# Patient Record
Sex: Female | Born: 1985 | Hispanic: Yes | Marital: Married | Smoking: Never smoker
Health system: Southern US, Community
[De-identification: ages and names within clinical notes are randomized; demographics above are authoritative.]

---
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
## 2005-05-05 ENCOUNTER — Emergency Department: Payer: Self-pay | Admitting: Emergency Medicine

## 2005-07-16 ENCOUNTER — Ambulatory Visit: Payer: Self-pay | Admitting: Family Medicine

## 2005-10-28 ENCOUNTER — Inpatient Hospital Stay: Payer: Self-pay | Admitting: Obstetrics and Gynecology

## 2007-06-21 IMAGING — US US OB US >=[ID] SNGL FETUS
1 series · 14 of 28 positions shown · non-contrast
Comparison: none

REASON FOR EXAM: Dates and anatomy
COMMENTS:

[Series 1: us ob us >=(id) sngl fetus · 0.39mm/px · 14 of 95 slices shown]
[im 4/95]
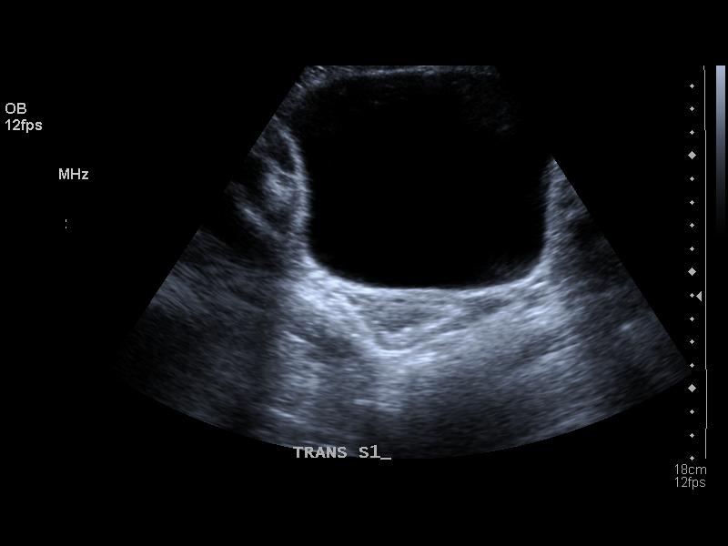
[im 11/95]
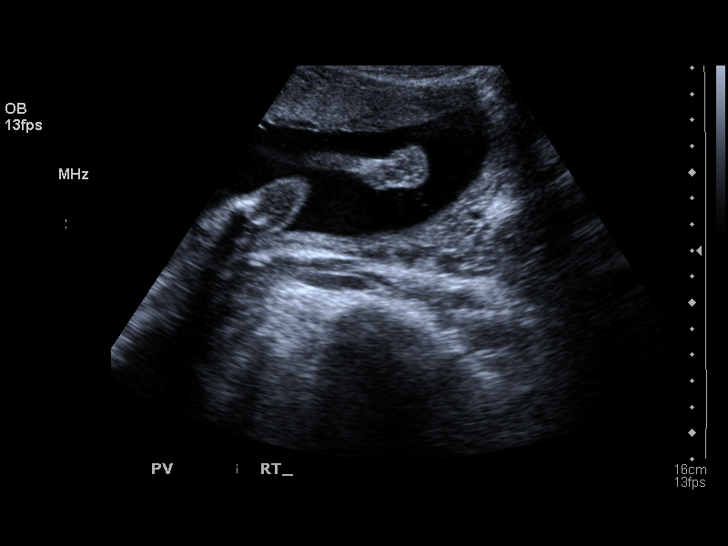
[im 18/95]
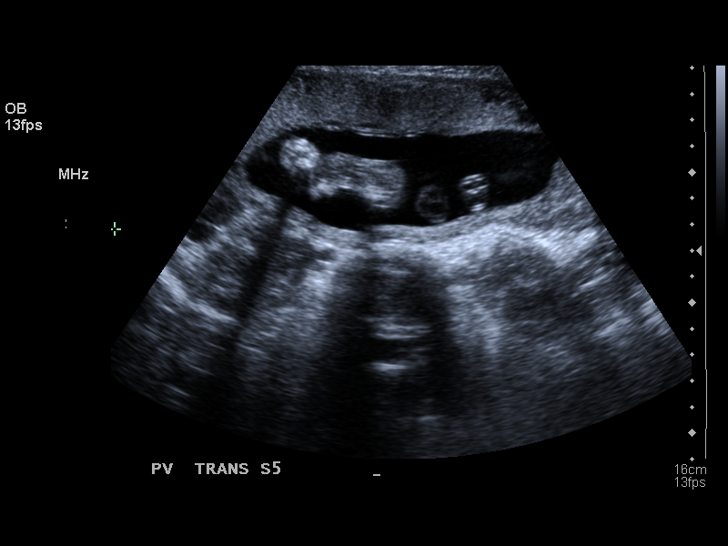
[im 25/95]
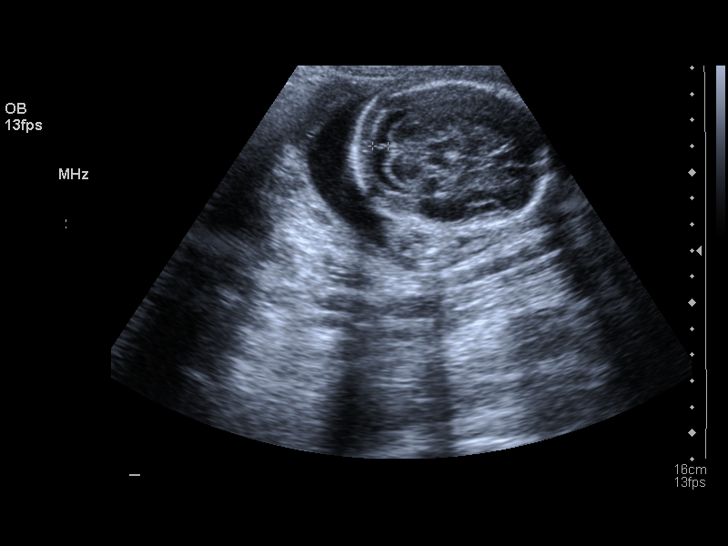
[im 32/95]
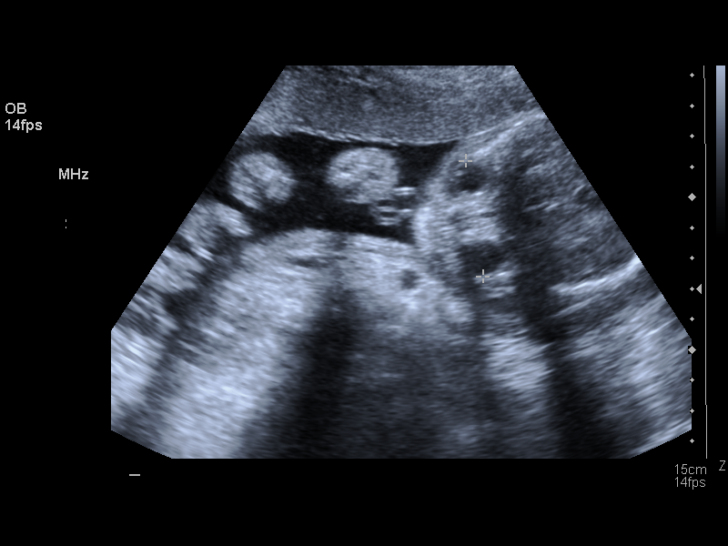
[im 39/95]
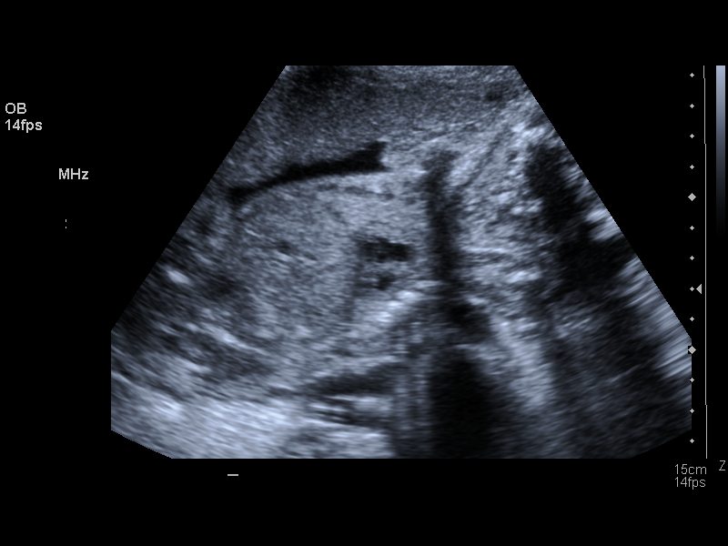
[im 46/95]
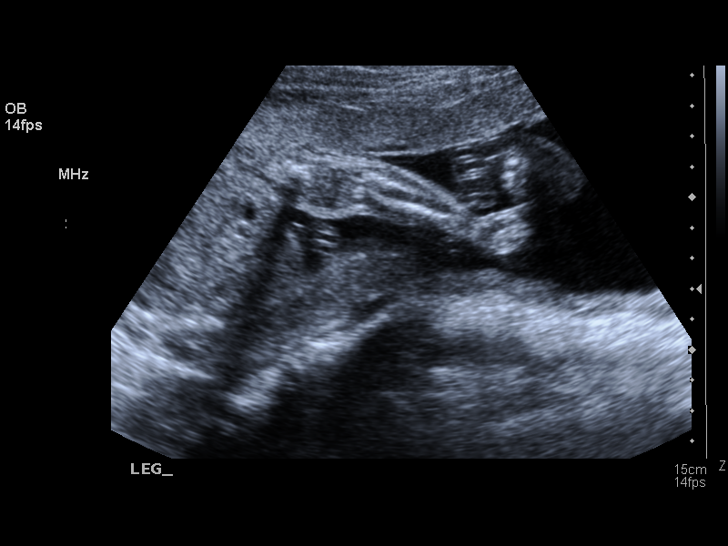
[im 53/95]
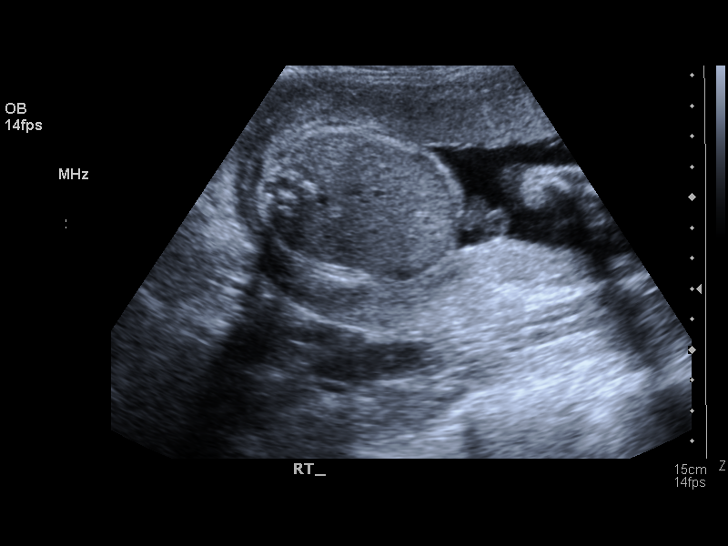
[im 60/95]
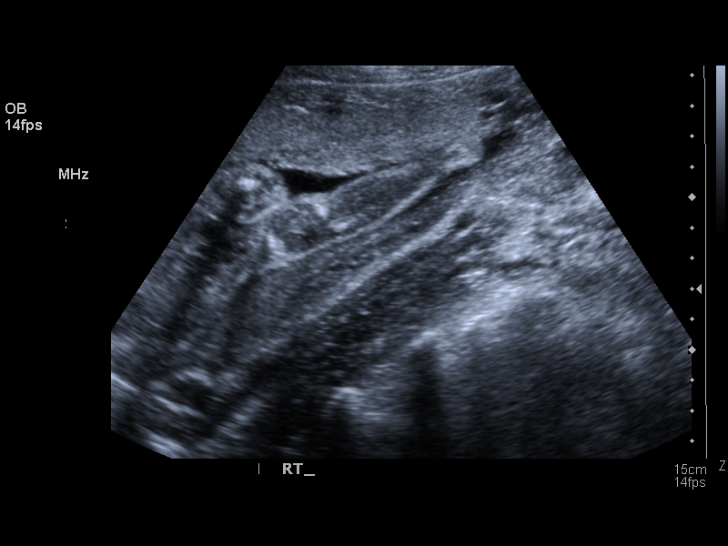
[im 67/95]
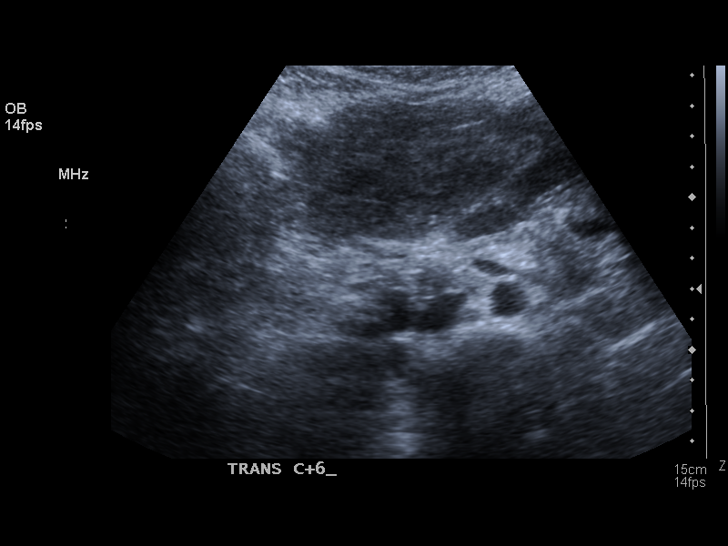
[im 74/95]
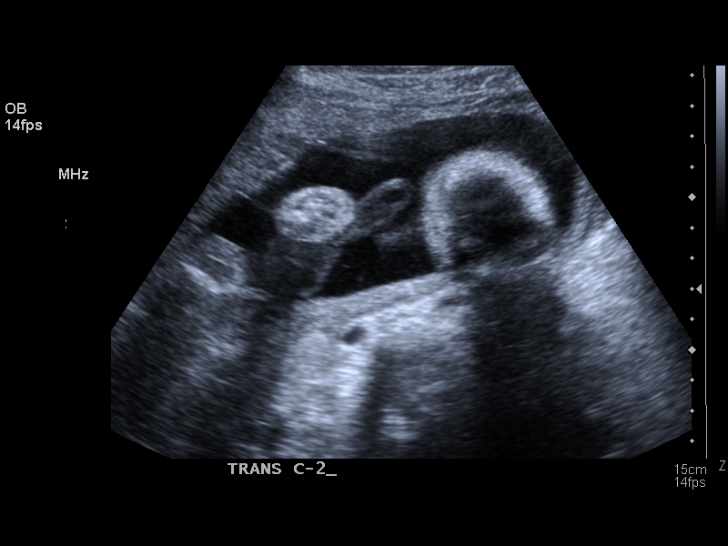
[im 81/95]
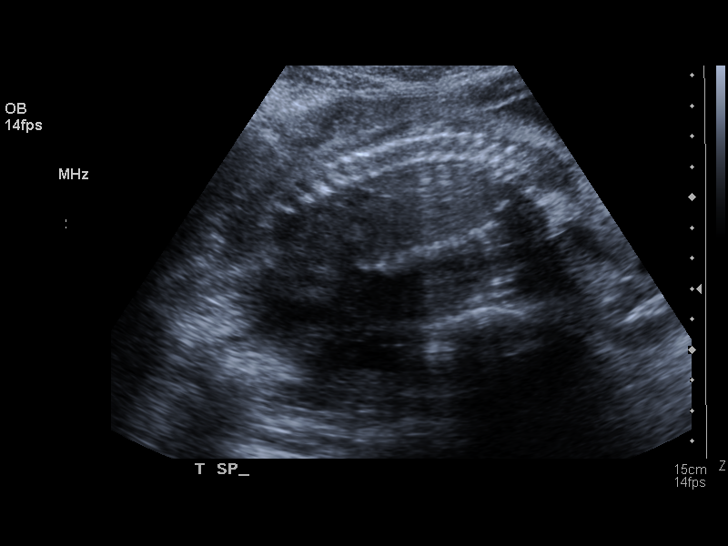
[im 88/95]
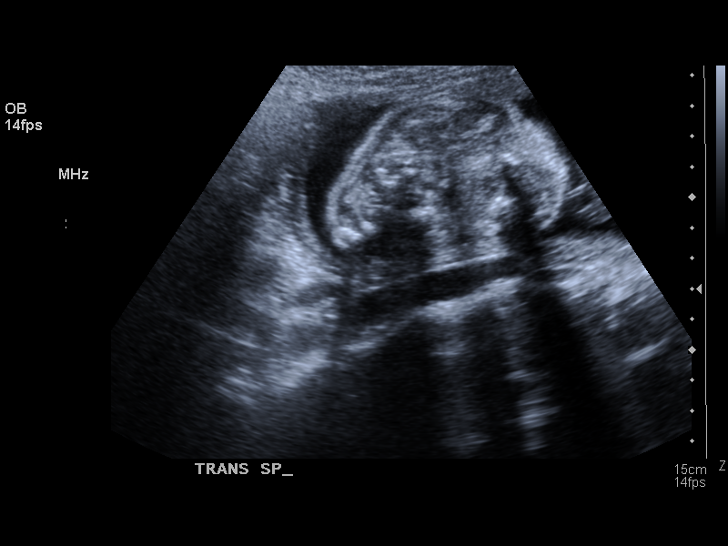
[im 95/95]
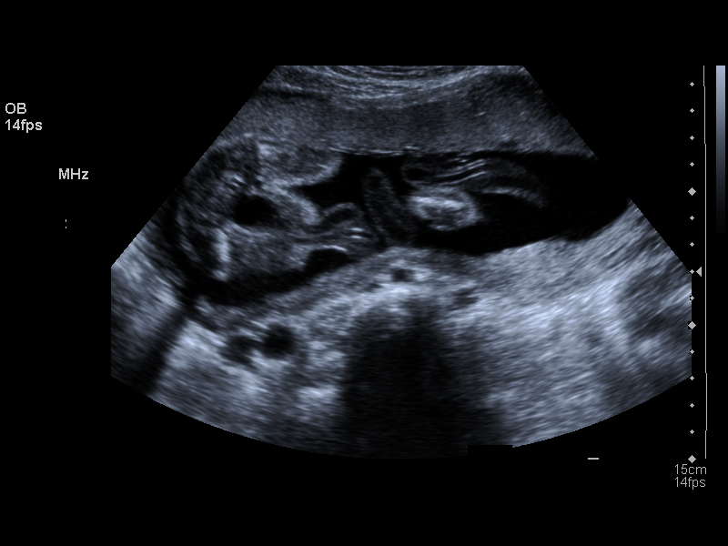

[14 of 28 positions shown; findings below may reference images not displayed]

PROCEDURE:     US  - US OB GREATER/OR EQUAL TO NMPDO  - July 16, 2005 [DATE]

RESULT:     There is a living, intrauterine gestation. Fetal heart rate was
monitored at 153 beats per minute. Presentation during the course of this
exam is variable. The placenta is anterior. Amniotic fluid volume appears
normal. The fetal heart, stomach and urinary bladder are visualized. No
hydrocephalus or hydronephrosis is seen.

Fetal measurements are as follows:

     BPD:    57.9 mm  (23 weeks, 5 days)
           HC:  223.9 mm  (24 weeks, 3 days)
        AC:  203.2 mm  (24 weeks, 6 days)
        FL:     42.7 mm  (24 weeks, 0 days)

EFW equals 699 grams. Average ultrasound age is 24 weeks, 2 days. Ultrasound
EDD is 11/03/2005.
IMPRESSION: Please see above.

## 2009-02-17 ENCOUNTER — Ambulatory Visit: Payer: Self-pay | Admitting: Certified Nurse Midwife

## 2009-06-13 ENCOUNTER — Inpatient Hospital Stay: Payer: Self-pay | Admitting: Obstetrics and Gynecology

## 2011-01-23 IMAGING — US US OB US >=[ID] SNGL FETUS
1 series · 17 of 28 positions shown · non-contrast
Comparison: none

REASON FOR EXAM: anatomy dates and placenta loc
COMMENTS:

[Series 1: us ob us >=(id) sngl fetus · 17 of 97 slices shown]
[im 1/97]
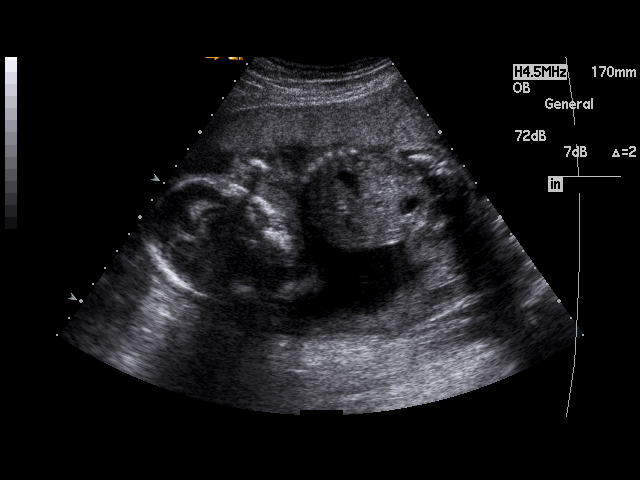
[im 8/97]
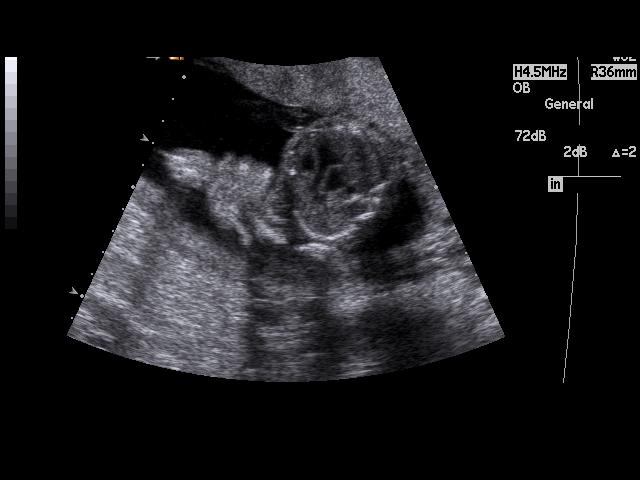
[im 15/97]
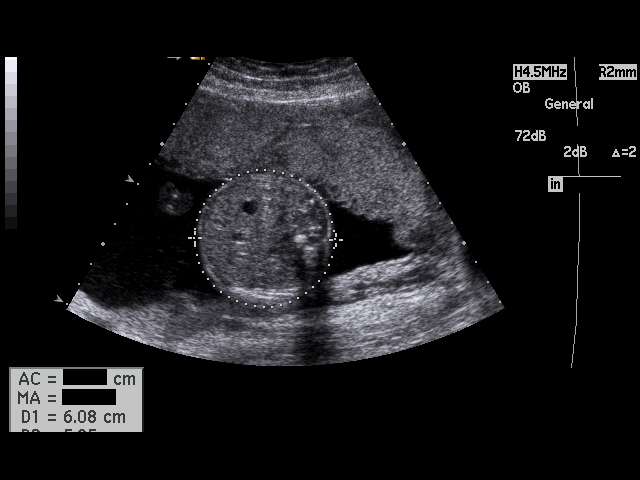
[im 18/97]
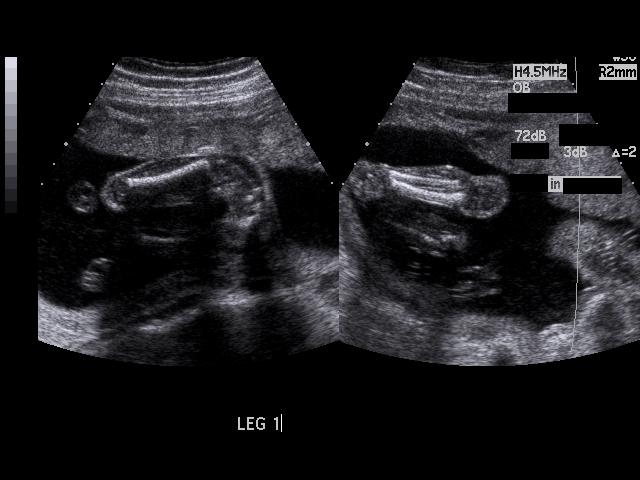
[im 25/97]
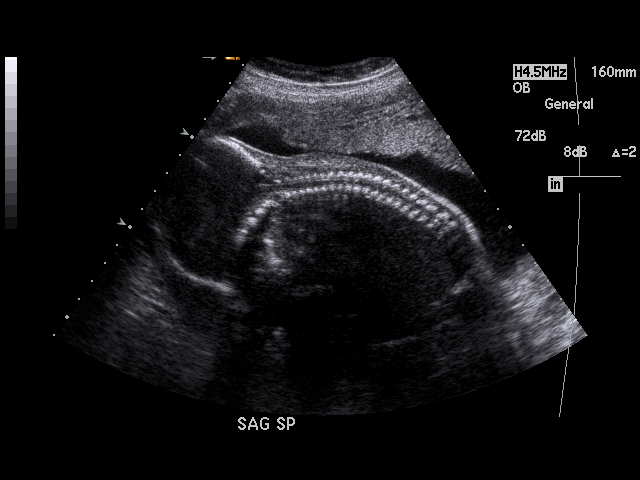
[im 33/97]
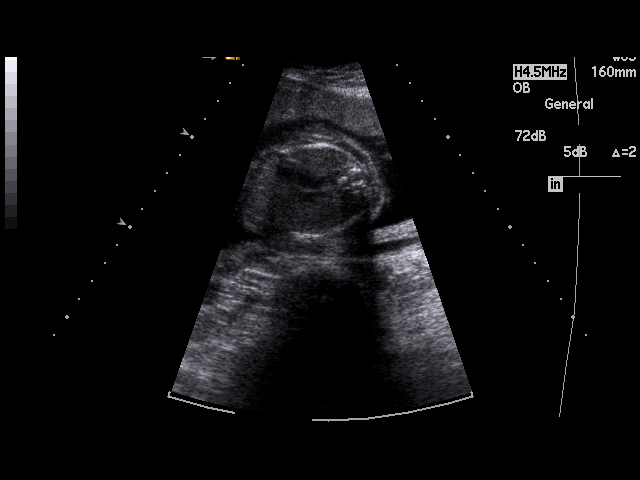
[im 36/97]
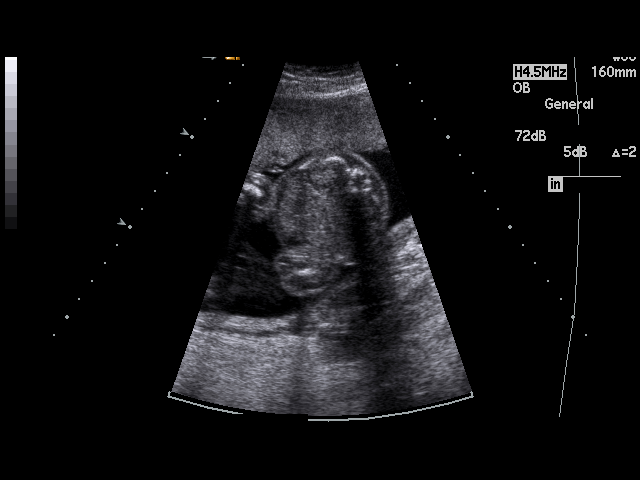
[im 43/97]
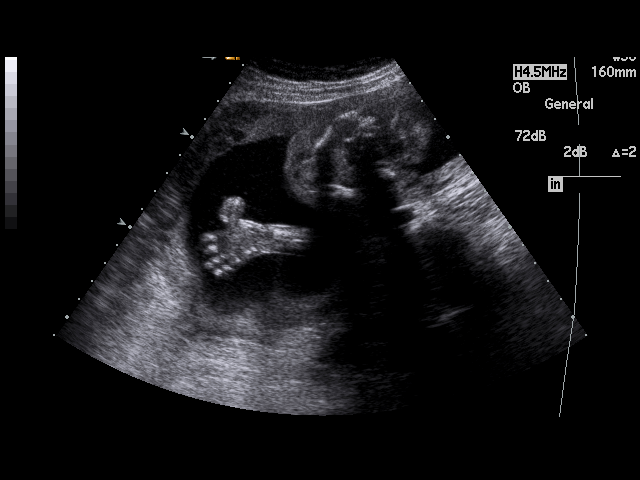
[im 50/97]
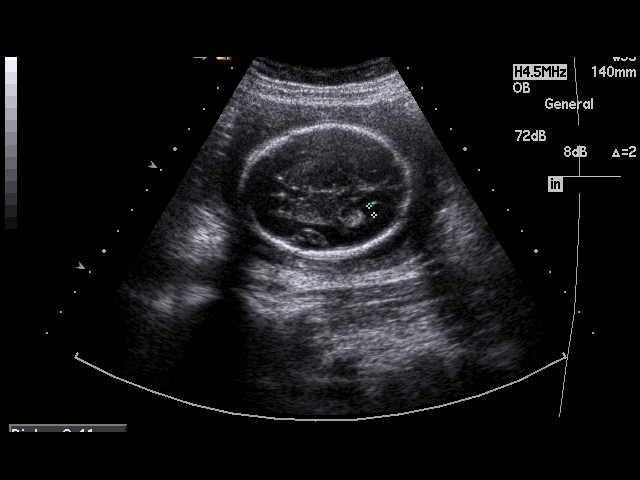
[im 54/97]
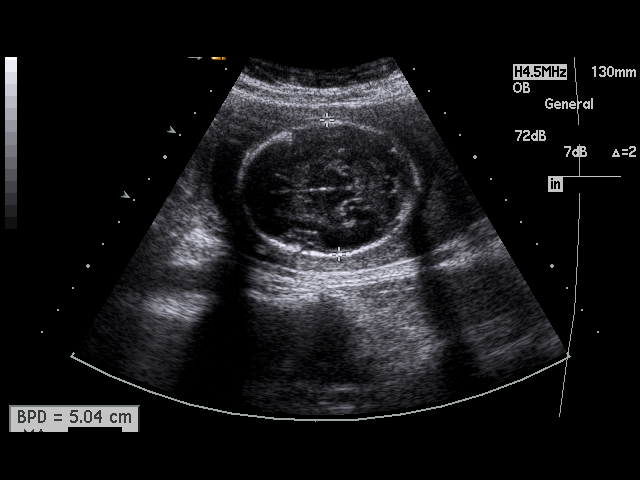
[im 61/97]
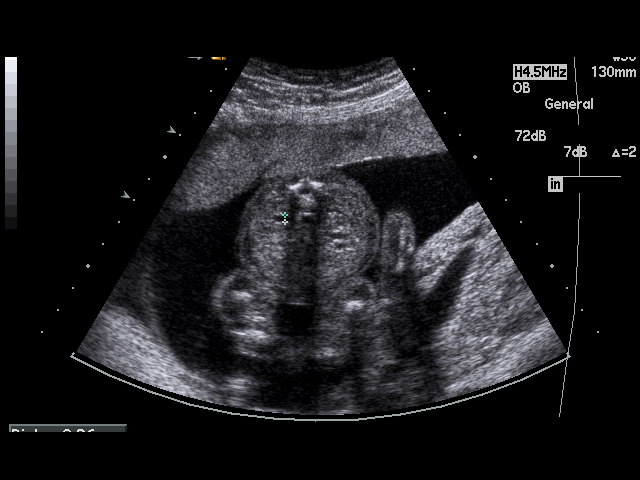
[im 65/97]
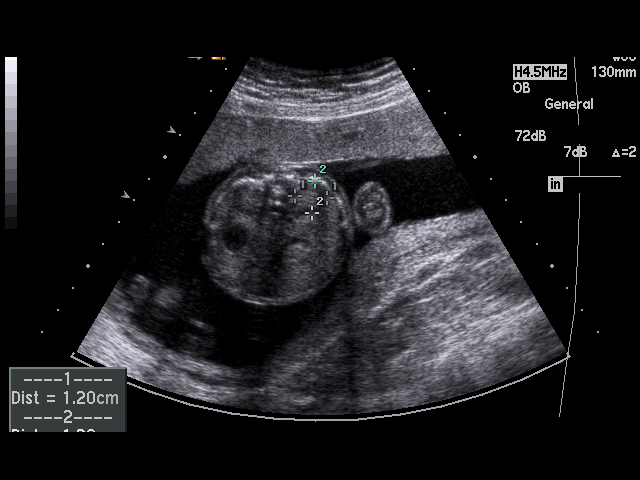
[im 72/97]
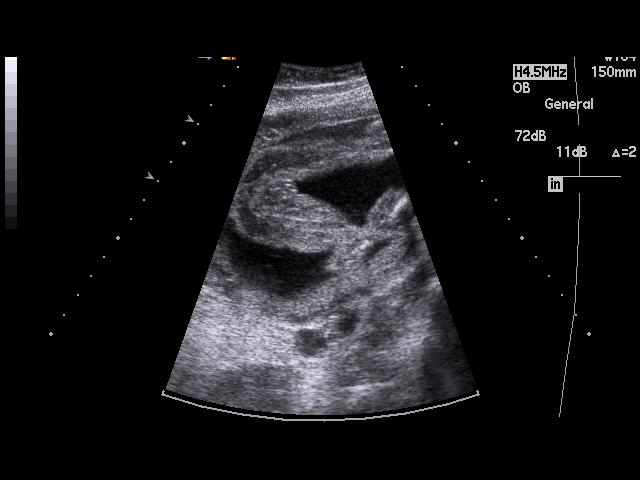
[im 79/97]
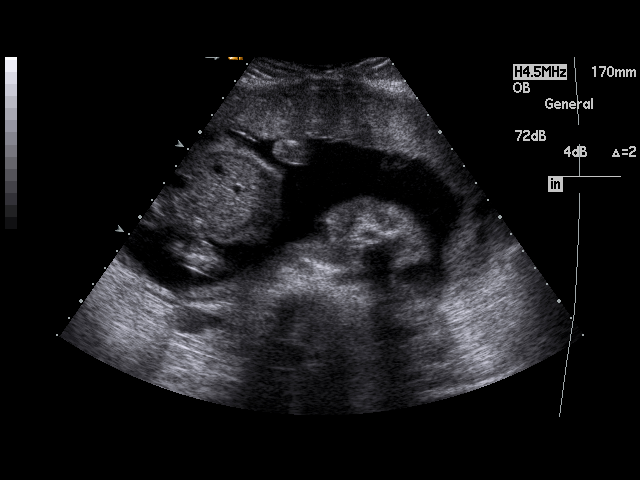
[im 82/97]
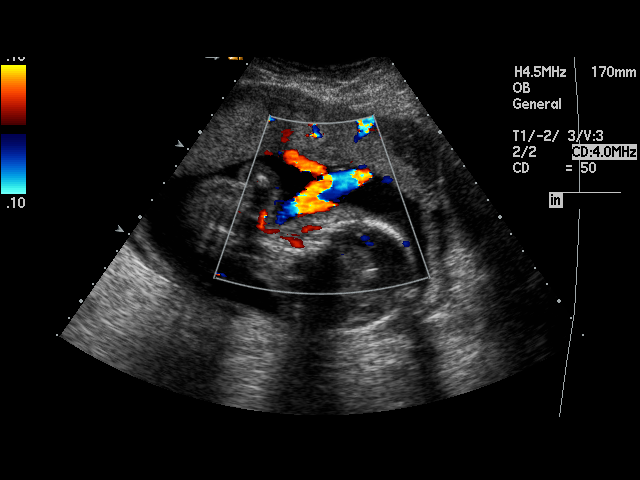
[im 89/97]
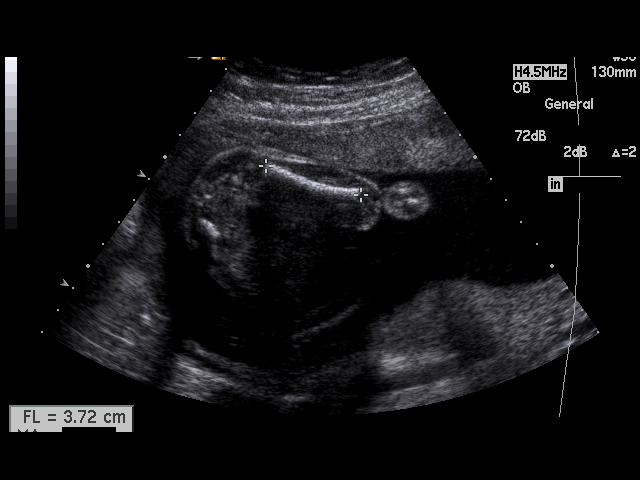
[im 97/97]
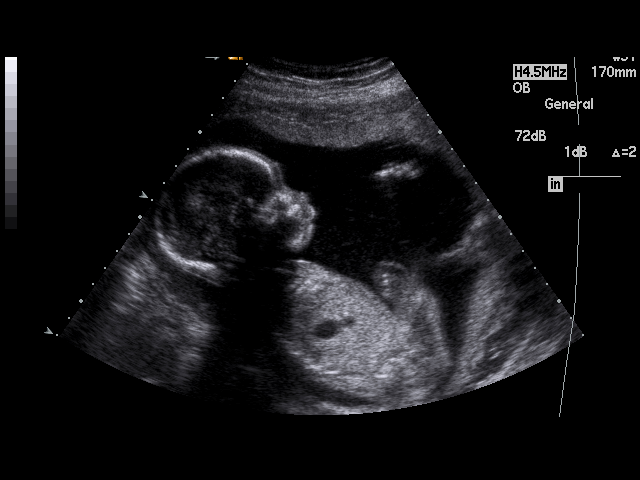

[17 of 28 positions shown; findings below may reference images not displayed]

PROCEDURE:     US  - US OB GREATER/OR EQUAL TO JPSFG  - February 17, 2009  [DATE]

RESULT:     There is observed a single living intrauterine gestation.
Presentation is variable during the course of this exam. Amnionic fluid
volume appears normal. AFI measures 17.77 cm. Fetal heart rate was monitored
at 136 beats per minute. The placenta is anterior and terminates
approximately 4.1 cm from the cervix. Cervix length measures 5.4 cm. The
fetal heart, stomach, and urinary bladder are visualized. No hydrocephalus
or hydronephrosis is seen. Fetal measurements are as follows:

BPD: 5.08 cm corresponding to 21 weeks-6 days
HC: 19.96 cm corresponding to 22 weeks-0 day
AC: 18.27 cm corresponding to 23 weeks-0 day
FL: 3.68 cm corresponding to 21 weeks-3 days

EFW is 526 grams + / - 70 grams. AFI is 17.77 cm. Average ultrasound age is 22
weeks-0 day. Ultrasound EDD is June 22, 2009.
IMPRESSION: 1.     Please see above.

## 2013-12-09 DIAGNOSIS — Z559 Problems related to education and literacy, unspecified: Secondary | ICD-10-CM | POA: Insufficient documentation

## 2013-12-18 ENCOUNTER — Emergency Department: Payer: Self-pay | Admitting: Emergency Medicine

## 2013-12-18 LAB — URINALYSIS, COMPLETE
Bilirubin,UR: NEGATIVE
Glucose,UR: NEGATIVE mg/dL (ref 0–75)
Nitrite: NEGATIVE
PH: 5 (ref 4.5–8.0)
RBC,UR: 22 /HPF (ref 0–5)
SPECIFIC GRAVITY: 1.023 (ref 1.003–1.030)
Squamous Epithelial: 13
WBC UR: 52 /HPF (ref 0–5)

## 2013-12-18 LAB — COMPREHENSIVE METABOLIC PANEL
ALBUMIN: 3.3 g/dL — AB (ref 3.4–5.0)
ANION GAP: 11 (ref 7–16)
Alkaline Phosphatase: 98 U/L
BUN: 12 mg/dL (ref 7–18)
Bilirubin,Total: 0.3 mg/dL (ref 0.2–1.0)
CO2: 18 mmol/L — AB (ref 21–32)
Calcium, Total: 8.3 mg/dL — ABNORMAL LOW (ref 8.5–10.1)
Chloride: 108 mmol/L — ABNORMAL HIGH (ref 98–107)
Creatinine: 0.98 mg/dL (ref 0.60–1.30)
EGFR (African American): >60
EGFR (Non-African Amer.): >60
Glucose: 107 mg/dL — ABNORMAL HIGH (ref 65–99)
OSMOLALITY: 274 (ref 275–301)
Potassium: 3 mmol/L — ABNORMAL LOW (ref 3.5–5.1)
SGOT(AST): 40 U/L — ABNORMAL HIGH (ref 15–37)
SGPT (ALT): 40 U/L
SODIUM: 137 mmol/L (ref 136–145)
Total Protein: 7.8 g/dL (ref 6.4–8.2)

## 2013-12-18 LAB — CBC
HCT: 35.2 % (ref 35.0–47.0)
HGB: 11.5 g/dL — ABNORMAL LOW (ref 12.0–16.0)
MCH: 25 pg — AB (ref 26.0–34.0)
MCHC: 32.6 g/dL (ref 32.0–36.0)
MCV: 77 fL — ABNORMAL LOW (ref 80–100)
Platelet: 228 10*3/uL (ref 150–440)
RBC: 4.59 10*6/uL (ref 3.80–5.20)
RDW: 15.4 % — ABNORMAL HIGH (ref 11.5–14.5)
WBC: 13.7 10*3/uL — ABNORMAL HIGH (ref 3.6–11.0)

## 2016-04-12 ENCOUNTER — Encounter: Payer: Self-pay | Admitting: Emergency Medicine

## 2016-04-12 ENCOUNTER — Emergency Department
Admission: EM | Admit: 2016-04-12 | Discharge: 2016-04-13 | Disposition: A | Discharge status: Home / Self Care | Payer: Self-pay | Attending: Emergency Medicine | Admitting: Emergency Medicine

## 2016-04-12 DIAGNOSIS — R42 Dizziness and giddiness: Secondary | ICD-10-CM | POA: Insufficient documentation

## 2016-04-12 DIAGNOSIS — R8299 Other abnormal findings in urine: Secondary | ICD-10-CM | POA: Insufficient documentation

## 2016-04-12 DIAGNOSIS — R1110 Vomiting, unspecified: Secondary | ICD-10-CM | POA: Insufficient documentation

## 2016-04-12 LAB — CBC WITH DIFFERENTIAL/PLATELET
BASOS PCT: 0 %
Basophils Absolute: 0 10*3/uL (ref 0–0.1)
Eosinophils Absolute: 0.1 10*3/uL (ref 0–0.7)
Eosinophils Relative: 1 %
HEMATOCRIT: 37.5 % (ref 35.0–47.0)
HEMOGLOBIN: 12.9 g/dL (ref 12.0–16.0)
Lymphocytes Relative: 23 %
Lymphs Abs: 1.9 10*3/uL (ref 1.0–3.6)
MCH: 27.6 pg (ref 26.0–34.0)
MCHC: 34.5 g/dL (ref 32.0–36.0)
MCV: 80 fL (ref 80.0–100.0)
Monocytes Absolute: 0.5 10*3/uL (ref 0.2–0.9)
Monocytes Relative: 7 %
NEUTROS ABS: 5.8 10*3/uL (ref 1.4–6.5)
Neutrophils Relative %: 69 %
Platelets: 227 10*3/uL (ref 150–440)
RBC: 4.68 MIL/uL (ref 3.80–5.20)
RDW: 14.1 % (ref 11.5–14.5)
WBC: 8.3 10*3/uL (ref 3.6–11.0)

## 2016-04-12 LAB — URINALYSIS, ROUTINE W REFLEX MICROSCOPIC
BACTERIA UA: NONE SEEN
Bilirubin Urine: NEGATIVE
Glucose, UA: NEGATIVE mg/dL
Ketones, ur: 20 mg/dL — AB
Leukocytes, UA: NEGATIVE
Nitrite: NEGATIVE
PROTEIN: 30 mg/dL — AB
Specific Gravity, Urine: 1.024 (ref 1.005–1.030)
pH: 7 (ref 5.0–8.0)

## 2016-04-12 LAB — BASIC METABOLIC PANEL WITH GFR
Anion gap: 8 (ref 5–15)
BUN: 12 mg/dL (ref 6–20)
CO2: 22 mmol/L (ref 22–32)
Calcium: 8.9 mg/dL (ref 8.9–10.3)
Chloride: 107 mmol/L (ref 101–111)
Creatinine, Ser: 0.69 mg/dL (ref 0.44–1.00)
GFR calc Af Amer: >60 mL/min
GFR calc non Af Amer: >60 mL/min
Glucose, Bld: 106 mg/dL — ABNORMAL HIGH (ref 65–99)
Potassium: 3.3 mmol/L — ABNORMAL LOW (ref 3.5–5.1)
Sodium: 137 mmol/L (ref 135–145)

## 2016-04-12 LAB — POCT PREGNANCY, URINE: PREG TEST UR: NEGATIVE

## 2016-04-12 MED ORDER — ONDANSETRON 4 MG PO TBDP
4.0000 mg | ORAL_TABLET | Freq: Once | ORAL | Status: AC | PRN
  Administered 2016-04-12: 4 mg via ORAL
  Filled 2016-04-12: qty 1

## 2016-04-12 MED ORDER — MECLIZINE HCL 25 MG PO TABS
25.0000 mg | ORAL_TABLET | Freq: Once | ORAL | Status: AC
  Administered 2016-04-12: 25 mg via ORAL

## 2016-04-12 MED ORDER — ONDANSETRON HCL 4 MG PO TABS
ORAL_TABLET | ORAL | Status: AC
  Filled 2016-04-12: qty 1

## 2016-04-12 MED ORDER — MECLIZINE HCL 25 MG PO TABS
ORAL_TABLET | ORAL | Status: AC
  Administered 2016-04-12: 25 mg via ORAL
  Filled 2016-04-12: qty 1

## 2016-04-12 NOTE — ED Triage Notes (Signed)
Pt states that she has vomited x6 today with no diarrhea. Pt states that yesterday she felt badly and a little bit dizzy. Pt is in NAD at this time.

## 2016-04-12 NOTE — ED Provider Notes (Signed)
[facility] Emergency Department Provider Note   ____________________________________________   I have reviewed the triage vital signs and the nursing notes.   HISTORY  Chief Complaint Emesis   History limited by: Not Limited   HPI Janet Daugherty is a 31 y.o. female who presents to the emergency department today because of dizziness and vomiting. The patient states that the symptoms started yesterday. She feels like the dizziness as the room spinning around her. She notices it when she stands or sits up. Goes away when she lies flat. She does however feel it when she rolls around in bed. She denies any associated abdominal pain with the vomiting. No blood in her vomiting. No diarrhea. No fevers or recent illness. Denies similar symptoms in the past.   History reviewed. No pertinent past medical history.  There are no active problems to display for this patient.   Past Surgical History:  Procedure Laterality Date  . CESAREAN SECTION      Prior to Admission medications   Not on File    Allergies Patient has no known allergies.  No family history on file.  Social History Social History  Substance Use Topics  . Smoking status: Never Smoker  . Smokeless tobacco: Never Used  . Alcohol use No    Review of Systems  Constitutional: Negative for fever. Cardiovascular: Negative for chest pain. Respiratory: Negative for shortness of breath. Gastrointestinal: Negative for abdominal pain. Positive for vomiting. Neurological: Positive for dizziness.  10-point ROS otherwise negative.  ____________________________________________   PHYSICAL EXAM:  VITAL SIGNS: ED Triage Vitals  Enc Vitals Group     BP 04/12/16 2130 106/69     Pulse Rate 04/12/16 2130 75     Resp 04/12/16 2130 18     Temp 04/12/16 2130 98.4 F (36.9 C)     Temp Source 04/12/16 2130 Oral     SpO2 04/12/16 2130 98 %     Weight 04/12/16 2132 150 lb (68 kg)     Height  04/12/16 2132 5\' 2"  (1.575 m)     Head Circumference --      Peak Flow --      Pain Score 04/12/16 2133 0     Pain Loc --      Pain Edu? --      Excl. in GC? --      Constitutional: Alert and oriented. Well appearing and in no distress. Eyes: Conjunctivae are normal. Slight rightward gaze nystagmus.  ENT   Head: Normocephalic and atraumatic.   Nose: No congestion/rhinnorhea.   Mouth/Throat: Mucous membranes are moist.   Neck: No stridor. Hematological/Lymphatic/Immunilogical: No cervical lymphadenopathy. Cardiovascular: Normal rate, regular rhythm.  No murmurs, rubs, or gallops. Respiratory: Normal respiratory effort without tachypnea nor retractions. Breath sounds are clear and equal bilaterally. No wheezes/rales/rhonchi. Gastrointestinal: Soft and non tender. No rebound. No guarding.  Genitourinary: Deferred Musculoskeletal: Normal range of motion in all extremities. No lower extremity edema. Neurologic:  Normal speech and language. No gross focal neurologic deficits are appreciated.  Skin:  Skin is warm, dry and intact. No rash noted. Psychiatric: Mood and affect are normal. Speech and behavior are normal. Patient exhibits appropriate insight and judgment.  ____________________________________________    LABS (pertinent positives/negatives)  Labs Reviewed  BASIC METABOLIC PANEL - Abnormal; Notable for the following:       Result Value   Potassium 3.3 (*)    Glucose, Bld 106 (*)    All other components within normal limits  URINALYSIS,  ROUTINE W REFLEX MICROSCOPIC - Abnormal; Notable for the following:    Color, Urine YELLOW (*)    APPearance CLEAR (*)    Hgb urine dipstick MODERATE (*)    Ketones, ur 20 (*)    Protein, ur 30 (*)    Squamous Epithelial / LPF 0-5 (*)    All other components within normal limits  CBC WITH DIFFERENTIAL/PLATELET  POCT PREGNANCY, URINE      ____________________________________________   EKG  None  ____________________________________________    RADIOLOGY  None  ____________________________________________   PROCEDURES  Procedures  ____________________________________________   INITIAL IMPRESSION / ASSESSMENT AND PLAN / ED COURSE  Pertinent labs & imaging results that were available during my care of the patient were reviewed by me and considered in my medical decision making (see chart for details).  Patient presented to the emergency department today with vertiginous symptoms. Patient's exam is consistent with BPPV. Patient did feel some improvement after meclizine. Will discharge home with further meclizine and instructions on how to perform the Epley maneuver. Additionally the patient's urine did have some white blood cells of her patient not complaining of any urinary symptoms. Will send for urine culture prior to treatment.  ____________________________________________   FINAL CLINICAL IMPRESSION(S) / ED DIAGNOSES  Final diagnoses:  Vertigo     Note: This dictation was prepared with Dragon dictation. Any transcriptional errors that result from this process are unintentional     Phineas Semen, MD 04/13/16 [phone]

## 2016-04-12 NOTE — ED Notes (Signed)
ED Provider at bedside, mobile interpreter at bedside.

## 2016-04-12 NOTE — ED Notes (Signed)
Pt states that when she is laying still she is not experiencing any s/s. Only upon movement does she feel dizzy and nauseated. Interpreter [place] is in triage.

## 2016-04-13 MED ORDER — MECLIZINE HCL 25 MG PO TABS: 25.0000 mg | ORAL_TABLET | Freq: Three times a day (TID) | ORAL | 0 refills | Status: DC | PRN

## 2016-04-13 NOTE — Discharge Instructions (Signed)
Please seek medical attention for any high fevers, chest pain, shortness of breath, change in behavior, persistent vomiting, bloody stool or any other new or concerning symptoms.  

## 2016-04-14 LAB — URINE CULTURE

## 2016-04-24 LAB — HM PAP SMEAR: HM Pap smear: NEGATIVE

## 2017-05-27 LAB — HM HIV SCREENING LAB: HM HIV Screening: NEGATIVE

## 2018-09-22 ENCOUNTER — Telehealth: Payer: Self-pay

## 2018-09-22 DIAGNOSIS — Z559 Problems related to education and literacy, unspecified: Secondary | ICD-10-CM

## 2018-09-22 NOTE — Telephone Encounter (Signed)
Attempted TC to patient to r/s depo.  Voicemail full.  Needs provider visit and Depo. Aileen Fass, RN

## 2018-09-23 ENCOUNTER — Ambulatory Visit: Payer: Self-pay

## 2018-09-23 NOTE — Telephone Encounter (Signed)
TC from patient.  R/S with provider to continue Depo. Aileen Fass, RN

## 2020-10-26 ENCOUNTER — Ambulatory Visit: Payer: Self-pay

## 2020-11-01 ENCOUNTER — Ambulatory Visit: Payer: Self-pay

## 2021-11-18 ENCOUNTER — Emergency Department: Payer: Self-pay

## 2021-11-18 ENCOUNTER — Other Ambulatory Visit: Payer: Self-pay

## 2021-11-18 ENCOUNTER — Observation Stay
Admission: EM | Admit: 2021-11-18 | Discharge: 2021-11-20 | Disposition: A | Discharge status: Home / Self Care | Payer: Self-pay | Attending: Surgery | Admitting: Surgery

## 2021-11-18 DIAGNOSIS — K8012 Calculus of gallbladder with acute and chronic cholecystitis without obstruction: Principal | ICD-10-CM | POA: Insufficient documentation

## 2021-11-18 DIAGNOSIS — K810 Acute cholecystitis: Secondary | ICD-10-CM | POA: Diagnosis present

## 2021-11-18 LAB — URINALYSIS, ROUTINE W REFLEX MICROSCOPIC
Bilirubin Urine: NEGATIVE
Glucose, UA: NEGATIVE mg/dL
Ketones, ur: NEGATIVE mg/dL
Leukocytes,Ua: NEGATIVE
Nitrite: NEGATIVE
Protein, ur: NEGATIVE mg/dL
Specific Gravity, Urine: 1.013 (ref 1.005–1.030)
pH: 5 (ref 5.0–8.0)

## 2021-11-18 LAB — COMPREHENSIVE METABOLIC PANEL
ALT: 46 U/L — ABNORMAL HIGH (ref 0–44)
AST: 42 U/L — ABNORMAL HIGH (ref 15–41)
Albumin: 3.9 g/dL (ref 3.5–5.0)
Alkaline Phosphatase: 78 U/L (ref 38–126)
Anion gap: 9 (ref 5–15)
BUN: 15 mg/dL (ref 6–20)
CO2: 24 mmol/L (ref 22–32)
Calcium: 9 mg/dL (ref 8.9–10.3)
Chloride: 104 mmol/L (ref 98–111)
Creatinine, Ser: 0.59 mg/dL (ref 0.44–1.00)
GFR, Estimated: >60 mL/min (ref >60)
Glucose, Bld: 90 mg/dL (ref 70–99)
Potassium: 3.8 mmol/L (ref 3.5–5.1)
Sodium: 137 mmol/L (ref 135–145)
Total Bilirubin: 0.5 mg/dL (ref 0.3–1.2)
Total Protein: 7.9 g/dL (ref 6.5–8.1)

## 2021-11-18 LAB — CBC
HCT: 35.4 % — ABNORMAL LOW (ref 36.0–46.0)
Hemoglobin: 11.1 g/dL — ABNORMAL LOW (ref 12.0–15.0)
MCH: 24.2 pg — ABNORMAL LOW (ref 26.0–34.0)
MCHC: 31.4 g/dL (ref 30.0–36.0)
MCV: 77.3 fL — ABNORMAL LOW (ref 80.0–100.0)
Platelets: 256 10*3/uL (ref 150–400)
RBC: 4.58 MIL/uL (ref 3.87–5.11)
RDW: 14.5 % (ref 11.5–15.5)
WBC: 6.2 10*3/uL (ref 4.0–10.5)
nRBC: 0 % (ref 0.0–0.2)

## 2021-11-18 LAB — LIPASE, BLOOD: Lipase: 34 U/L (ref 11–51)

## 2021-11-18 LAB — PREGNANCY, URINE: Preg Test, Ur: NEGATIVE

## 2021-11-18 MED ORDER — PANTOPRAZOLE SODIUM 40 MG IV SOLR
40.0000 mg | Freq: Every day | INTRAVENOUS | Status: DC
  Administered 2021-11-19 (×2): 40 mg via INTRAVENOUS
  Filled 2021-11-18 (×2): qty 10

## 2021-11-18 MED ORDER — ONDANSETRON HCL 4 MG/2ML IJ SOLN: 4.0000 mg | Freq: Four times a day (QID) | INTRAMUSCULAR | Status: DC | PRN

## 2021-11-18 MED ORDER — ONDANSETRON HCL 4 MG/2ML IJ SOLN
4.0000 mg | Freq: Once | INTRAMUSCULAR | Status: AC
  Administered 2021-11-18: 4 mg via INTRAVENOUS
  Filled 2021-11-18: qty 2

## 2021-11-18 MED ORDER — HYDROCODONE-ACETAMINOPHEN 5-325 MG PO TABS
1.0000 | ORAL_TABLET | ORAL | Status: DC | PRN
  Administered 2021-11-19 (×2): 2 via ORAL
  Filled 2021-11-18 (×2): qty 2

## 2021-11-18 MED ORDER — DOCUSATE SODIUM 100 MG PO CAPS: 100.0000 mg | ORAL_CAPSULE | Freq: Two times a day (BID) | ORAL | Status: DC | PRN

## 2021-11-18 MED ORDER — PIPERACILLIN-TAZOBACTAM 3.375 G IVPB 30 MIN
3.3750 g | Freq: Once | INTRAVENOUS | Status: AC
  Administered 2021-11-18: 3.375 g via INTRAVENOUS
  Filled 2021-11-18: qty 50

## 2021-11-18 MED ORDER — MORPHINE SULFATE (PF) 4 MG/ML IV SOLN
4.0000 mg | Freq: Once | INTRAVENOUS | Status: AC
  Administered 2021-11-18: 4 mg via INTRAVENOUS
  Filled 2021-11-18: qty 1

## 2021-11-18 MED ORDER — ONDANSETRON 4 MG PO TBDP: 4.0000 mg | ORAL_TABLET | Freq: Four times a day (QID) | ORAL | Status: DC | PRN

## 2021-11-18 MED ORDER — INDOCYANINE GREEN 25 MG IV SOLR
1.2500 mg | Freq: Once | INTRAVENOUS | Status: AC
  Administered 2021-11-19: 1.25 mg via INTRAVENOUS
  Filled 2021-11-18: qty 0.5

## 2021-11-18 MED ORDER — PIPERACILLIN-TAZOBACTAM 3.375 G IVPB
3.3750 g | Freq: Three times a day (TID) | INTRAVENOUS | Status: DC
  Administered 2021-11-19 – 2021-11-20 (×3): 3.375 g via INTRAVENOUS
  Filled 2021-11-18 (×3): qty 50

## 2021-11-18 MED ORDER — SODIUM CHLORIDE 0.9 % IV BOLUS
1000.0000 mL | Freq: Once | INTRAVENOUS | Status: AC
  Administered 2021-11-18: 1000 mL via INTRAVENOUS

## 2021-11-18 MED ORDER — MORPHINE SULFATE (PF) 2 MG/ML IV SOLN: 2.0000 mg | INTRAVENOUS | Status: DC | PRN

## 2021-11-18 MED ORDER — SODIUM CHLORIDE 0.9 % IV SOLN: INTRAVENOUS | Status: DC

## 2021-11-18 MED ORDER — TRAMADOL HCL 50 MG PO TABS: 50.0000 mg | ORAL_TABLET | Freq: Four times a day (QID) | ORAL | Status: DC | PRN

## 2021-11-18 NOTE — ED Provider Notes (Signed)
[facility] Provider Note    Event Date/Time   First MD Initiated Contact with Patient 11/18/21 2201     (approximate)  History   Chief Complaint: Abdominal Pain  HPI  Janet Daugherty is a 36 y.o. female with no past medical history presents to the emergency department for right upper quadrant abdominal pain.  According to the patient over the past 3 days she has been experiencing intermittent right upper quadrant abdominal pain however over the past 24 hours it has been persistent and worse.  Patient denies any known fever.  Physical Exam   Triage Vital Signs: ED Triage Vitals  Enc Vitals Group     BP 11/18/21 1735 135/75     Pulse Rate 11/18/21 1735 67     Resp 11/18/21 1735 18     Temp 11/18/21 1737 98.6 F (37 C)     Temp Source 11/18/21 1737 Oral     SpO2 11/18/21 1735 98 %     Weight 11/18/21 1736 145 lb (65.8 kg)     Height 11/18/21 1736 5' (1.524 m)     Head Circumference --      Peak Flow --      Pain Score 11/18/21 1736 9     Pain Loc --      Pain Edu? --      Excl. in GC? --     Most recent vital signs: Vitals:   11/18/21 1735 11/18/21 1737  BP: 135/75   Pulse: 67   Resp: 18   Temp:  98.6 F (37 C)  SpO2: 98%     General: Awake, no distress.  CV:  Good peripheral perfusion.  Regular rate and rhythm  Resp:  Normal effort.  Equal breath sounds bilaterally.  Abd:  No distention.  Soft, right upper quadrant abdominal tenderness to palpation.  Mild guarding.  No rebound.   ED Results / Procedures / Treatments   EKG  EKG viewed and interpreted by myself shows a normal sinus rhythm at 71 bpm with a narrow QRS, normal axis, normal intervals, no concerning ST changes.  RADIOLOGY  Ultrasound shows cholelithiasis with a nonmobile stone in the gallbladder neck positive Murphy sign concerning for acute cholecystitis.   MEDICATIONS ORDERED IN ED: Medications - No data to display   IMPRESSION / MDM / ASSESSMENT AND PLAN  / ED COURSE  I reviewed the triage vital signs and the nursing notes.  Patient's presentation is most consistent with acute presentation with potential threat to life or bodily function.  Patient presents emergency department for right upper quadrant abdominal pain intermittent over the past 3 days but constant and persistent over the past 24 hours.  Describes moderate pain in the right upper quadrant she is moderately tender to palpation in this area with some mild localized guarding but no rebound.  Patient's vital signs are reassuring.  Lab work shows a normal CBC with a normal white blood cell count, chemistry does show LFT elevation AST and ALT in the mid 40s.  Lipase reassuringly normal as well as total bilirubin.  Given the patient's elevated LFTs with tenderness in the right upper quadrant and ultrasound showing nonmobile gallbladder neck stones concern for possible cholecystitis versus biliary colic.  I spoke to Dr. Psychiatry normal surgery who will admit the patient to his service for further treatment and likely operation.  We will treat pain nausea and IV hydrate while awaiting surgical recommendations.  Patient agreeable to plan.  FINAL CLINICAL  IMPRESSION(S) / ED DIAGNOSES   Right upper quadrant abdominal pain Acute cholecystitis Biliary colic   Note:  This document was prepared using Dragon voice recognition software and may include unintentional dictation errors.   Minna Antis, MD 11/18/21 2213

## 2021-11-18 NOTE — ED Triage Notes (Signed)
Pt states she has been having epigastric pain since Thursday with vomiting- pt denies any urinary symptoms or diarrhea

## 2021-11-18 NOTE — ED Notes (Signed)
Pt brought to room 14 for further evaluation.  This RN assumed care.

## 2021-11-18 NOTE — ED Provider Triage Note (Signed)
Emergency Medicine Provider Triage Evaluation Note  Janet Daugherty, a 36 y.o. female  was evaluated in triage.  Pt complains of gastric abdominal pain.  Reports onset of symptoms since Thursday with associated vomiting but denies any urinary symptoms, chest pain, shortness of breath, or bowel changes.  Review of Systems  Positive: Epigastric abdominal pain, N/V Negative: Dysuria, chest pain, shortness of breath  Physical Exam  BP 135/75 (BP Location: Left Arm)   Pulse 67   Temp 98.6 F (37 C) (Oral)   Resp 18   Ht 5' (1.524 m)   Wt 65.8 kg   SpO2 98%   BMI 28.32 kg/m  Gen:   Awake, no distress  NAD Resp:  Normal effort CTA MSK:   Moves extremities without difficulty  ABD:  Soft, nontender  Medical Decision Making  Medically screening exam initiated at 5:55 PM.  Appropriate orders placed.  Janet Daugherty was informed that the remainder of the evaluation will be completed by another provider, this initial triage assessment does not replace that evaluation, and the importance of remaining in the ED until their evaluation is complete.  Patient to the ED for evaluation of epigastric abdominal pain for the last 3 to 4 days.  She reports some associated nausea and vomiting but denies any bowel changes.   Lissa Hoard, PA-C 11/18/21 1820

## 2021-11-18 NOTE — H&P (Signed)
Subjective:   CC: acute cholecystitis  HPI:  Janet Daugherty is a 36 y.o. female who is consulted by [facility] for evaluation of above cc.  Symptoms were first noted 4 days ago. Pain is sharp, RUQ, non-radiating.  Associated with nausea, exacerbated by nothing specific.     Past Medical History:  has no past medical history on file.  Past Surgical History:  has a past surgical history that includes Cesarean section.  Family History: reviewed and not relevant to CC  Social History:  reports that she has never smoked. She has never used smokeless tobacco. She reports that she does not drink alcohol and does not use drugs.  Current Medications:  Prior to Admission medications   Medication Sig Start Date End Date Taking? Authorizing Provider  meclizine (ANTIVERT) 25 MG tablet Take 1 tablet (25 mg total) by mouth 3 (three) times daily as needed for dizziness. 04/13/16   Phineas Semen, MD  medroxyPROGESTERone (DEPO-PROVERA) 150 MG/ML injection Inject 150 mg into the muscle every 3 (three) months. 04/24/16   Tessa Lerner, NP    Allergies:  Allergies as of 11/18/2021   (No Known Allergies)    ROS:  General: Denies weight loss, weight gain, fatigue, fevers, chills, and night sweats. Eyes: Denies blurry vision, double vision, eye pain, itchy eyes, and tearing. Ears: Denies hearing loss, earache, and ringing in ears. Nose: Denies sinus pain, congestion, infections, runny nose, and nosebleeds. Mouth/throat: Denies hoarseness, sore throat, bleeding gums, and difficulty swallowing. Heart: Denies chest pain, palpitations, racing heart, irregular heartbeat, leg pain or swelling, and decreased activity tolerance. Respiratory: Denies breathing difficulty, shortness of breath, wheezing, cough, and sputum. GI: Denies change in appetite, heartburn, nausea, vomiting, constipation, diarrhea, and blood in stool. GU: Denies difficulty urinating, pain with urinating, urgency, frequency,  blood in urine. Musculoskeletal: Denies joint stiffness, pain, swelling, muscle weakness. Skin: Denies rash, itching, mass, tumors, sores, and boils Neurologic: Denies headache, fainting, dizziness, seizures, numbness, and tingling. Psychiatric: Denies depression, anxiety, difficulty sleeping, and memory loss. Endocrine: Denies heat or cold intolerance, and increased thirst or urination. Blood/lymph: Denies easy bruising, and swollen glands     Objective:     BP 119/73 (BP Location: Left Arm)   Pulse 64   Temp 98.4 F (36.9 C) (Oral)   Resp 16   Ht 5' (1.524 m)   Wt 65.8 kg   SpO2 100%   BMI 28.32 kg/m    Constitutional :  alert, cooperative, appears stated age, and no distress  Lymphatics/Throat:  no asymmetry, masses, or scars  Respiratory:  clear to auscultation bilaterally  Cardiovascular:  regular rate and rhythm  Gastrointestinal: Soft, no guarding, focal TTP RUQ .   Musculoskeletal: Steady movement  Skin: Cool and moist  Psychiatric: Normal affect, non-agitated, not confused       LABS:     Latest Ref Rng & Units 11/18/2021    5:45 PM 04/12/2016    9:42 PM 12/18/2013    4:05 AM  CMP  Glucose 70 - 99 mg/dL 90  921  194   BUN 6 - 20 mg/dL 15  12  12    Creatinine 0.44 - 1.00 mg/dL  1.74  0.81   Sodium 135 - 145 mmol/L 137  137  137   Potassium 3.5 - 5.1 mmol/L 3.8  3.3  3.0   Chloride 98 - 111 mmol/L 104  107  108   CO2 22 - 32 mmol/L 24  22  18    Calcium  8.9 - 10.3 mg/dL 9.0  8.9  8.3   Total Protein 6.5 - 8.1 g/dL 7.9   7.8   Total Bilirubin 0.3 - 1.2 mg/dL 0.5   0.3   Alkaline Phos 38 - 126 U/L 78   98   AST 15 - 41 U/L 42   40   ALT 0 - 44 U/L 46   40       Latest Ref Rng & Units 11/18/2021    5:45 PM 04/12/2016    9:42 PM 12/18/2013    4:05 AM  CBC  WBC 4.0 - 10.5 K/uL 6.2  8.3  13.7   Hemoglobin 12.0 - 15.0 g/dL 87.5  64.3  32.9   Hematocrit 36.0 - 46.0 % 35.4  37.5  35.2   Platelets 150 - 400 K/uL 256  227  228      RADS: CLINICAL DATA:   Right upper quadrant pain.   EXAM: ULTRASOUND ABDOMEN LIMITED RIGHT UPPER QUADRANT   COMPARISON:  None Available.   FINDINGS: Gallbladder:   Gallstones measuring up to 1.5 cm, including a non mobile stone in the gallbladder neck. Gallbladder wall thickness of 3 mm, upper limits of normal. A positive sonographic Eulah Pont sign was reported by the sonographer.   Common bile duct:   Diameter: 3 mm   Liver:   No focal lesion identified. Within normal limits in parenchymal echogenicity. Portal vein is patent on color Doppler imaging with normal direction of blood flow towards the liver.   Other: None.   IMPRESSION: Cholelithiasis, including a nonmobile stone in the gallbladder neck, with positive sonographic Murphy sign. This is concerning for acute cholecystitis in the appropriate clinical setting.     Electronically Signed   By: Sebastian Ache M.D.   On: 11/18/2021 19:48   Assessment:      Acute cholecystitis.  H&P consistent  Plan:      Discussed the risk of surgery including post-op infxn, seroma, biloma, chronic pain, poor-delayed wound healing, retained gallstone, conversion to open procedure, post-op SBO or ileus, and need for additional procedures to address said risks.  The risks of general anesthetic including MI, CVA, sudden death or even reaction to anesthetic medications also discussed. Alternatives include continued observation.  Benefits include possible symptom relief, prevention of complications including acute cholecystitis, pancreatitis.  Typical post operative recovery of 3-5 days rest, continued pain in area and incision sites, possible loose stools up to 4-6 weeks, also discussed.  The patient understands the risks, any and all questions were answered to the patient's satisfaction.  To OR for robotic lap chole with Dr. Everlene Farrier tomorrow.  IVF, abx in the meantime.  NPO after midnight  labs/images/medications/previous chart entries reviewed personally and  relevant changes/updates noted above.

## 2021-11-18 NOTE — ED Notes (Signed)
Urine sample obtained.  Surgical physician at bedside.

## 2021-11-18 NOTE — ED Notes (Signed)
SBAR report given to accepting report.  Pt taken to room 218 via tranporter.

## 2021-11-19 ENCOUNTER — Encounter
Admission: EM | Disposition: A | Discharge status: Home / Self Care | Payer: Self-pay | Source: Home / Self Care | Attending: Emergency Medicine

## 2021-11-19 ENCOUNTER — Encounter: Payer: Self-pay | Admitting: Surgery

## 2021-11-19 ENCOUNTER — Observation Stay: Payer: Self-pay | Admitting: General Practice

## 2021-11-19 ENCOUNTER — Other Ambulatory Visit: Payer: Self-pay

## 2021-11-19 DIAGNOSIS — K810 Acute cholecystitis: Secondary | ICD-10-CM

## 2021-11-19 LAB — HEPATIC FUNCTION PANEL
ALT: 59 U/L — ABNORMAL HIGH (ref 0–44)
AST: 61 U/L — ABNORMAL HIGH (ref 15–41)
Albumin: 3.3 g/dL — ABNORMAL LOW (ref 3.5–5.0)
Alkaline Phosphatase: 59 U/L (ref 38–126)
Bilirubin, Direct: <0.1 mg/dL (ref 0.0–0.2)
Total Bilirubin: 0.7 mg/dL (ref 0.3–1.2)
Total Protein: 6.5 g/dL (ref 6.5–8.1)

## 2021-11-19 LAB — SURGICAL PCR SCREEN
MRSA, PCR: NEGATIVE
Staphylococcus aureus: NEGATIVE

## 2021-11-19 LAB — BASIC METABOLIC PANEL
Anion gap: 6 (ref 5–15)
BUN: 13 mg/dL (ref 6–20)
CO2: 23 mmol/L (ref 22–32)
Calcium: 8 mg/dL — ABNORMAL LOW (ref 8.9–10.3)
Chloride: 106 mmol/L (ref 98–111)
Creatinine, Ser: 0.73 mg/dL (ref 0.44–1.00)
GFR, Estimated: >60 mL/min (ref >60)
Glucose, Bld: 95 mg/dL (ref 70–99)
Potassium: 3.5 mmol/L (ref 3.5–5.1)
Sodium: 135 mmol/L (ref 135–145)

## 2021-11-19 LAB — CBC
HCT: 31.8 % — ABNORMAL LOW (ref 36.0–46.0)
Hemoglobin: 10.1 g/dL — ABNORMAL LOW (ref 12.0–15.0)
MCH: 24.3 pg — ABNORMAL LOW (ref 26.0–34.0)
MCHC: 31.8 g/dL (ref 30.0–36.0)
MCV: 76.6 fL — ABNORMAL LOW (ref 80.0–100.0)
Platelets: 217 10*3/uL (ref 150–400)
RBC: 4.15 MIL/uL (ref 3.87–5.11)
RDW: 14.5 % (ref 11.5–15.5)
WBC: 6.1 10*3/uL (ref 4.0–10.5)
nRBC: 0 % (ref 0.0–0.2)

## 2021-11-19 LAB — HIV ANTIBODY (ROUTINE TESTING W REFLEX): HIV Screen 4th Generation wRfx: NONREACTIVE

## 2021-11-19 SURGERY — CHOLECYSTECTOMY, ROBOT-ASSISTED, LAPAROSCOPIC
Anesthesia: General

## 2021-11-19 MED ORDER — KETOROLAC TROMETHAMINE 30 MG/ML IJ SOLN
INTRAMUSCULAR | Status: AC
  Filled 2021-11-19: qty 1

## 2021-11-19 MED ORDER — DEXAMETHASONE SODIUM PHOSPHATE 10 MG/ML IJ SOLN
INTRAMUSCULAR | Status: DC | PRN
  Administered 2021-11-19: 8 mg via INTRAVENOUS

## 2021-11-19 MED ORDER — ROCURONIUM BROMIDE 10 MG/ML (PF) SYRINGE
PREFILLED_SYRINGE | INTRAVENOUS | Status: AC
  Filled 2021-11-19: qty 10

## 2021-11-19 MED ORDER — ONDANSETRON HCL 4 MG/2ML IJ SOLN
4.0000 mg | Freq: Once | INTRAMUSCULAR | Status: AC
Start: 2021-11-19 — End: 2021-11-19
  Administered 2021-11-19: 4 mg via INTRAVENOUS

## 2021-11-19 MED ORDER — PHENYLEPHRINE 80 MCG/ML (10ML) SYRINGE FOR IV PUSH (FOR BLOOD PRESSURE SUPPORT)
PREFILLED_SYRINGE | INTRAVENOUS | Status: DC | PRN
  Administered 2021-11-19: 80 ug via INTRAVENOUS

## 2021-11-19 MED ORDER — KETOROLAC TROMETHAMINE 30 MG/ML IJ SOLN
INTRAMUSCULAR | Status: DC | PRN
  Administered 2021-11-19: 30 mg via INTRAVENOUS

## 2021-11-19 MED ORDER — ROCURONIUM BROMIDE 100 MG/10ML IV SOLN
INTRAVENOUS | Status: DC | PRN
  Administered 2021-11-19: 80 mg via INTRAVENOUS

## 2021-11-19 MED ORDER — LIDOCAINE HCL (PF) 2 % IJ SOLN
INTRAMUSCULAR | Status: AC
  Filled 2021-11-19: qty 5

## 2021-11-19 MED ORDER — OXYCODONE HCL 5 MG PO TABS: 5.0000 mg | ORAL_TABLET | ORAL | Status: DC | PRN

## 2021-11-19 MED ORDER — ONDANSETRON HCL 4 MG/2ML IJ SOLN
INTRAMUSCULAR | Status: DC | PRN
  Administered 2021-11-19: 4 mg via INTRAVENOUS

## 2021-11-19 MED ORDER — PROPOFOL 10 MG/ML IV BOLUS
INTRAVENOUS | Status: AC
  Filled 2021-11-19: qty 20

## 2021-11-19 MED ORDER — FENTANYL CITRATE (PF) 100 MCG/2ML IJ SOLN
INTRAMUSCULAR | Status: DC | PRN
Start: 2021-11-19 — End: 2021-11-19
  Administered 2021-11-19: 25 ug via INTRAVENOUS
  Administered 2021-11-19: 50 ug via INTRAVENOUS
  Administered 2021-11-19: 25 ug via INTRAVENOUS

## 2021-11-19 MED ORDER — PROPOFOL 10 MG/ML IV BOLUS
INTRAVENOUS | Status: DC | PRN
  Administered 2021-11-19: 120 mg via INTRAVENOUS

## 2021-11-19 MED ORDER — OXYCODONE HCL 5 MG PO TABS
5.0000 mg | ORAL_TABLET | Freq: Once | ORAL | Status: AC | PRN
  Administered 2021-11-19: 5 mg

## 2021-11-19 MED ORDER — KETOROLAC TROMETHAMINE 30 MG/ML IJ SOLN
30.0000 mg | Freq: Four times a day (QID) | INTRAMUSCULAR | Status: DC
  Administered 2021-11-19 – 2021-11-20 (×2): 30 mg via INTRAVENOUS
  Filled 2021-11-19 (×2): qty 1

## 2021-11-19 MED ORDER — BUPIVACAINE LIPOSOME 1.3 % IJ SUSP
INTRAMUSCULAR | Status: AC
  Filled 2021-11-19: qty 20

## 2021-11-19 MED ORDER — PIPERACILLIN-TAZOBACTAM 3.375 G IVPB
INTRAVENOUS | Status: AC
  Administered 2021-11-19: 3.375 g via INTRAVENOUS
  Filled 2021-11-19: qty 50

## 2021-11-19 MED ORDER — FENTANYL CITRATE (PF) 100 MCG/2ML IJ SOLN
25.0000 ug | INTRAMUSCULAR | Status: DC | PRN
  Administered 2021-11-19: 50 ug via INTRAVENOUS

## 2021-11-19 MED ORDER — SUGAMMADEX SODIUM 200 MG/2ML IV SOLN
INTRAVENOUS | Status: DC | PRN
  Administered 2021-11-19: 400 mg via INTRAVENOUS

## 2021-11-19 MED ORDER — FENTANYL CITRATE (PF) 100 MCG/2ML IJ SOLN
INTRAMUSCULAR | Status: AC
  Filled 2021-11-19: qty 2

## 2021-11-19 MED ORDER — ACETAMINOPHEN 500 MG PO TABS
1000.0000 mg | ORAL_TABLET | Freq: Four times a day (QID) | ORAL | Status: DC
  Administered 2021-11-19 – 2021-11-20 (×2): 1000 mg via ORAL
  Filled 2021-11-19 (×2): qty 2

## 2021-11-19 MED ORDER — STERILE WATER FOR IRRIGATION IR SOLN
Status: DC | PRN
  Administered 2021-11-19: 3000 mL

## 2021-11-19 MED ORDER — MIDAZOLAM HCL 2 MG/2ML IJ SOLN
INTRAMUSCULAR | Status: AC
  Filled 2021-11-19: qty 2

## 2021-11-19 MED ORDER — OXYCODONE HCL 5 MG/5ML PO SOLN: 5.0000 mg | Freq: Once | ORAL | Status: AC | PRN

## 2021-11-19 MED ORDER — OXYCODONE HCL 5 MG PO TABS
ORAL_TABLET | ORAL | Status: AC
  Filled 2021-11-19: qty 1

## 2021-11-19 MED ORDER — BUPIVACAINE-EPINEPHRINE (PF) 0.25% -1:200000 IJ SOLN
INTRAMUSCULAR | Status: DC | PRN
  Administered 2021-11-19: 50 mL

## 2021-11-19 MED ORDER — ONDANSETRON HCL 4 MG/2ML IJ SOLN
INTRAMUSCULAR | Status: AC
  Filled 2021-11-19: qty 2

## 2021-11-19 MED ORDER — MIDAZOLAM HCL 2 MG/2ML IJ SOLN
INTRAMUSCULAR | Status: DC | PRN
  Administered 2021-11-19: 2 mg via INTRAVENOUS

## 2021-11-19 MED ORDER — BUPIVACAINE-EPINEPHRINE (PF) 0.25% -1:200000 IJ SOLN
INTRAMUSCULAR | Status: AC
  Filled 2021-11-19: qty 30

## 2021-11-19 MED ORDER — SUCCINYLCHOLINE CHLORIDE 200 MG/10ML IV SOSY
PREFILLED_SYRINGE | INTRAVENOUS | Status: AC
  Filled 2021-11-19: qty 10

## 2021-11-19 MED ORDER — PHENYLEPHRINE 80 MCG/ML (10ML) SYRINGE FOR IV PUSH (FOR BLOOD PRESSURE SUPPORT)
PREFILLED_SYRINGE | INTRAVENOUS | Status: AC
  Filled 2021-11-19: qty 10

## 2021-11-19 MED ORDER — PNEUMOCOCCAL 20-VAL CONJ VACC 0.5 ML IM SUSY: 0.5000 mL | PREFILLED_SYRINGE | INTRAMUSCULAR | Status: DC

## 2021-11-19 MED ORDER — LIDOCAINE HCL (CARDIAC) PF 100 MG/5ML IV SOSY
PREFILLED_SYRINGE | INTRAVENOUS | Status: DC | PRN
  Administered 2021-11-19: 100 mg via INTRAVENOUS

## 2021-11-19 SURGICAL SUPPLY — 42 items
CANNULA REDUC XI 12-8 STAPL (CANNULA) ×1
CANNULA REDUCER 12-8 DVNC XI (CANNULA) ×1 IMPLANT
CLIP LIGATING HEMO O LOK GREEN (MISCELLANEOUS) ×1 IMPLANT
DERMABOND ADVANCED (GAUZE/BANDAGES/DRESSINGS) ×1
DERMABOND ADVANCED .7 DNX12 (GAUZE/BANDAGES/DRESSINGS) ×1 IMPLANT
DRAPE ARM DVNC X/XI (DISPOSABLE) ×4 IMPLANT
DRAPE COLUMN DVNC XI (DISPOSABLE) ×1 IMPLANT
DRAPE DA VINCI XI ARM (DISPOSABLE) ×4
DRAPE DA VINCI XI COLUMN (DISPOSABLE) ×1
ELECT CAUTERY BLADE 6.4 (BLADE) ×1 IMPLANT
ELECT REM PT RETURN 9FT ADLT (ELECTROSURGICAL) ×1
ELECTRODE REM PT RTRN 9FT ADLT (ELECTROSURGICAL) ×1 IMPLANT
GLOVE BIO SURGEON STRL SZ7 (GLOVE) ×2 IMPLANT
GOWN STRL REUS W/ TWL LRG LVL3 (GOWN DISPOSABLE) ×4 IMPLANT
GOWN STRL REUS W/TWL LRG LVL3 (GOWN DISPOSABLE) ×4
IRRIGATION STRYKERFLOW (MISCELLANEOUS) IMPLANT
IRRIGATOR STRYKERFLOW (MISCELLANEOUS) ×1
KIT PINK PAD W/HEAD ARE REST (MISCELLANEOUS) ×1 IMPLANT
KIT PINK PAD W/HEAD ARM REST (MISCELLANEOUS) ×1 IMPLANT
LABEL OR SOLS (LABEL) ×1 IMPLANT
MANIFOLD NEPTUNE II (INSTRUMENTS) ×1 IMPLANT
NEEDLE HYPO 22GX1.5 SAFETY (NEEDLE) ×1 IMPLANT
NS IRRIG 500ML POUR BTL (IV SOLUTION) ×1 IMPLANT
OBTURATOR OPTICAL STANDARD 8MM (TROCAR) ×1
OBTURATOR OPTICAL STND 8 DVNC (TROCAR) ×1
OBTURATOR OPTICALSTD 8 DVNC (TROCAR) ×1 IMPLANT
PACK LAP CHOLECYSTECTOMY (MISCELLANEOUS) ×1 IMPLANT
PENCIL SMOKE EVACUATOR (MISCELLANEOUS) ×1 IMPLANT
SEAL CANN UNIV 5-8 DVNC XI (MISCELLANEOUS) ×3 IMPLANT
SEAL XI 5MM-8MM UNIVERSAL (MISCELLANEOUS) ×3
SET TUBE SMOKE EVAC HIGH FLOW (TUBING) ×1 IMPLANT
SOLUTION ELECTROLUBE (MISCELLANEOUS) ×1 IMPLANT
SPIKE FLUID TRANSFER (MISCELLANEOUS) ×1 IMPLANT
SPONGE T-LAP 18X18 ~~LOC~~+RFID (SPONGE) ×1 IMPLANT
STAPLER CANNULA SEAL DVNC XI (STAPLE) ×1 IMPLANT
STAPLER CANNULA SEAL XI (STAPLE) ×1
SUT MNCRL AB 4-0 PS2 18 (SUTURE) ×1 IMPLANT
SUT VICRYL 0 AB UR-6 (SUTURE) ×2 IMPLANT
SYR 20ML LL LF (SYRINGE) ×1 IMPLANT
SYS BAG RETRIEVAL 10MM (BASKET) ×1
SYSTEM BAG RETRIEVAL 10MM (BASKET) ×1 IMPLANT
WATER STERILE IRR 3000ML UROMA (IV SOLUTION) IMPLANT

## 2021-11-19 NOTE — Anesthesia Procedure Notes (Signed)
Procedure Name: Intubation Date/Time: 11/19/2021 11:41 AM  Performed by: Karoline Caldwell, CRNAPre-anesthesia Checklist: Patient identified, Patient being monitored, Timeout performed, Emergency Drugs available and Suction available Patient Re-evaluated:Patient Re-evaluated prior to induction Oxygen Delivery Method: Circle system utilized Preoxygenation: Pre-oxygenation with 100% oxygen Induction Type: IV induction, Rapid sequence and Cricoid Pressure applied Laryngoscope Size: McGraph and 3 Grade View: Grade I Tube type: Oral Tube size: 7.0 mm Number of attempts: 1 Airway Equipment and Method: Stylet Placement Confirmation: ETT inserted through vocal cords under direct vision, positive ETCO2 and breath sounds checked- equal and bilateral Secured at: 20 cm Tube secured with: Tape Dental Injury: Teeth and Oropharynx as per pre-operative assessment

## 2021-11-19 NOTE — Transfer of Care (Signed)
Immediate Anesthesia Transfer of Care Note  Patient: Janet Daugherty  Procedure(s) Performed: XI ROBOTIC ASSISTED LAPAROSCOPIC CHOLECYSTECTOMY  Patient Location: PACU  Anesthesia Type:General  Level of Consciousness: awake, alert  and sedated  Airway & Oxygen Therapy: Patient Spontanous Breathing  Post-op Assessment: Report given to RN and Post -op Vital signs reviewed and stable  Post vital signs: Reviewed and stable  Last Vitals:  Vitals Value Taken Time  BP 127/89 11/19/21 1249  Temp    Pulse 72 11/19/21 1249  Resp 19 11/19/21 1249  SpO2 98 % 11/19/21 1249  Vitals shown include unvalidated device data.  Last Pain:  Vitals:   11/19/21 1249  TempSrc:   PainSc: 0-No pain      Patients Stated Pain Goal: 0 (11/19/21 0022)  Complications: No notable events documented.

## 2021-11-19 NOTE — Op Note (Signed)
Robotic assisted laparoscopic Cholecystectomy  Pre-operative Diagnosis: Acute cholecystitis  Post-operative Diagnosis: same  Procedure:  Robotic assisted laparoscopic Cholecystectomy  Surgeon: Sterling Big, MD FACS  Anesthesia: Gen. with endotracheal tube  Findings: Acute Cholecystitis with hydrops   Estimated Blood Loss: 15cc       Specimens: Gallbladder           Complications: none   Procedure Details  The patient was seen again in the Holding Room. The benefits, complications, treatment options, and expected outcomes were discussed with the patient. The risks of bleeding, infection, recurrence of symptoms, failure to resolve symptoms, bile duct damage, bile duct leak, retained common bile duct stone, bowel injury, any of which could require further surgery and/or ERCP, stent, or papillotomy were reviewed with the patient. The likelihood of improving the patient's symptoms with return to their baseline status is good.  The patient and/or family concurred with the proposed plan, giving informed consent.  The patient was taken to Operating Room, identified  and the procedure verified as Laparoscopic Cholecystectomy.  A Time Out was held and the above information confirmed.  Prior to the induction of general anesthesia, antibiotic prophylaxis was administered. VTE prophylaxis was in place. General endotracheal anesthesia was then administered and tolerated well. After the induction, the abdomen was prepped with Chloraprep and draped in the sterile fashion. The patient was positioned in the supine position.  Cut down technique was used to enter the abdominal cavity and a Hasson trochar was placed after two vicryl stitches were anchored to the fascia. Pneumoperitoneum was then created with CO2 and tolerated well without any adverse changes in the patient's vital signs.  Three 8-mm ports were placed under direct vision. All skin incisions  were infiltrated with a local anesthetic agent  before making the incision and placing the trocars.   The patient was positioned  in reverse Trendelenburg, robot was brought to the surgical field and docked in the standard fashion.  We made sure all the instrumentation was kept indirect view at all times and that there were no collision between the arms. I scrubbed out and went to the console.  The gallbladder was identified, it was distended, we needed to decompress it with suction device and hydrops was aspirated, the fundus grasped and retracted cephalad. Adhesions were lysed bluntly. The infundibulum was grasped and retracted laterally, exposing the peritoneum overlying the triangle of Calot. This was then divided and exposed in a blunt fashion. An extended critical view of the cystic duct and cystic artery was obtained.  The cystic duct was clearly identified and bluntly dissected.   Artery and duct were double clipped and divided. Using ICG cholangiography we visualize the cystic duct and CBD injuries, no evidence of bile injuries. The gallbladder was taken from the gallbladder fossa in a retrograde fashion with the electrocautery.  Hemostasis was achieved with the electrocautery. nspection of the right upper quadrant was performed. No bleeding, bile duct injury or leak, or bowel injury was noted. Robotic instruments and robotic arms were undocked in the standard fashion.  I scrubbed back in.  The gallbladder was removed and placed in an Endocatch bag.   Pneumoperitoneum was released.  The periumbilical port site was closed with interrumpted 0 Vicryl sutures. 4-0 subcuticular Monocryl was used to close the skin. Dermabond was  applied.  The patient was then extubated and brought to the recovery room in stable condition. Sponge, lap, and needle counts were correct at closure and at the conclusion of the case.  Caroleen Hamman, MD, FACS

## 2021-11-19 NOTE — Anesthesia Preprocedure Evaluation (Signed)
Anesthesia Evaluation  Patient identified by MRN, date of birth, ID band Patient awake    Reviewed: Allergy & Precautions, NPO status , Patient's Chart, lab work & pertinent test results  History of Anesthesia Complications Negative for: history of anesthetic complications  Airway Mallampati: III  TM Distance: >3 FB Neck ROM: full    Dental  (+) Dental Advidsory Given, Teeth Intact   Pulmonary neg pulmonary ROS, neg shortness of breath, neg COPD, neg recent URI,    Pulmonary exam normal        Cardiovascular (-) hypertension(-) Past MI and (-) CABG negative cardio ROS Normal cardiovascular exam     Neuro/Psych negative neurological ROS  negative psych ROS   GI/Hepatic negative GI ROS, Neg liver ROS,   Endo/Other  negative endocrine ROS  Renal/GU      Musculoskeletal   Abdominal   Peds  Hematology negative hematology ROS (+)   Anesthesia Other Findings History reviewed. No pertinent past medical history.  Past Surgical History: No date: CESAREAN SECTION  BMI    Body Mass Index: 28.32 kg/m      Reproductive/Obstetrics negative OB ROS                             Anesthesia Physical Anesthesia Plan  ASA: 2 and emergent  Anesthesia Plan: General ETT   Post-op Pain Management:    Induction: Intravenous  PONV Risk Score and Plan: 4 or greater and Ondansetron, Dexamethasone and Midazolam  Airway Management Planned: Oral ETT  Additional Equipment:   Intra-op Plan:   Post-operative Plan: Extubation in OR  Informed Consent: I have reviewed the patients History and Physical, chart, labs and discussed the procedure including the risks, benefits and alternatives for the proposed anesthesia with the patient or authorized representative who has indicated his/her understanding and acceptance.     Dental Advisory Given  Plan Discussed with: Anesthesiologist, CRNA and  Surgeon  Anesthesia Plan Comments: (Patient consented for risks of anesthesia including but not limited to:  - adverse reactions to medications - damage to eyes, teeth, lips or other oral mucosa - nerve damage due to positioning  - sore throat or hoarseness - Damage to heart, brain, nerves, lungs, other parts of body or loss of life  Patient voiced understanding.)        Anesthesia Quick Evaluation

## 2021-11-19 NOTE — Anesthesia Postprocedure Evaluation (Signed)
Anesthesia Post Note  Patient: [place]  Procedure(s) Performed: XI ROBOTIC ASSISTED LAPAROSCOPIC CHOLECYSTECTOMY  Patient location during evaluation: PACU Anesthesia Type: General Level of consciousness: awake and alert Pain management: pain level controlled Vital Signs Assessment: post-procedure vital signs reviewed and stable Respiratory status: spontaneous breathing, nonlabored ventilation, respiratory function stable and patient connected to nasal cannula oxygen Cardiovascular status: blood pressure returned to baseline and stable Postop Assessment: no apparent nausea or vomiting Anesthetic complications: no   No notable events documented.   Last Vitals:  Vitals:   11/19/21 1344 11/19/21 1403  BP: 126/78 117/76  Pulse:  78  Resp:  16  Temp: 36.9 C   SpO2:  100%    Last Pain:  Vitals:   11/19/21 1446  TempSrc:   PainSc: Asleep                 Stephanie Coup

## 2021-11-19 NOTE — Progress Notes (Signed)
36 presented w acute cholecystitis, persistent pain and + Murphy sign. I do recommend Robotic cholecystectomy.The risks, benefits, complications, treatment options, and expected outcomes were discussed with the patient. The possibilities of bleeding, recurrent infection, finding a normal gallbladder, perforation of viscus organs, damage to surrounding structures, bile leak, abscess formation, needing a drain placed, the need for additional procedures, reaction to medication, pulmonary aspiration,  failure to diagnose a condition, the possible need to convert to an open procedure, and creating a complication requiring transfusion or operation were discussed with the patient. The patient and/or family concurred with the proposed plan, giving informed consent.

## 2021-11-20 MED ORDER — AMOXICILLIN-POT CLAVULANATE 875-125 MG PO TABS: 1.0000 | ORAL_TABLET | Freq: Two times a day (BID) | ORAL | 0 refills | Status: AC

## 2021-11-20 MED ORDER — OXYCODONE HCL 5 MG PO TABS: 5.0000 mg | ORAL_TABLET | Freq: Four times a day (QID) | ORAL | 0 refills | Status: DC | PRN

## 2021-11-20 MED ORDER — IBUPROFEN 600 MG PO TABS: 600.0000 mg | ORAL_TABLET | Freq: Four times a day (QID) | ORAL | 0 refills | Status: DC | PRN

## 2021-11-20 NOTE — Progress Notes (Signed)
Discharge instructions reviewed with patient via interpreter. Questions were answered and PIV removed with no complications. Patient states she will DC with her husband

## 2021-11-20 NOTE — TOC Initial Note (Signed)
Transition of Care [facility]) - Initial/Assessment Note    Patient Details  Name: Janet Daugherty MRN: [id number] Date of Birth: May 11, 1985  [facility] CM/SW Contact:    Chapman Fitch, RN Phone Number: 11/20/2021, 11:16 AM  Clinical Narrative:                  Patient to discharge today Ride at bedside. Provided spanish application for [facility]   [facility]  Goodrx coupons texted to phone per patient request       Patient Goals and CMS Choice        Expected Discharge Plan and Services           Expected Discharge Date: 11/20/21                                    Prior Living Arrangements/Services                       Activities of Daily Living Home Assistive Devices/Equipment: None ADL Screening (condition at time of admission) Patient's cognitive ability adequate to safely complete daily activities?: Yes Is the patient deaf or have difficulty hearing?: No Does the patient have difficulty seeing, even when wearing glasses/contacts?: No Does the patient have difficulty concentrating, remembering, or making decisions?: Yes Patient able to express need for assistance with ADLs?: Yes Does the patient have difficulty dressing or bathing?: No Independently performs ADLs?: Yes (appropriate for developmental age) Does the patient have difficulty walking or climbing stairs?: No Weakness of Legs: None Weakness of Arms/Hands: None  Permission Sought/Granted                  Emotional Assessment              Admission diagnosis:  Acute cholecystitis [K81.0] Cholecystitis, acute [K81.0] Patient Active Problem List   Diagnosis Date Noted   Acute cholecystitis 11/18/2021   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

## 2021-11-20 NOTE — Discharge Instructions (Signed)

## 2021-11-20 NOTE — Discharge Summary (Signed)
[facility] SURGICAL DISCHARGE SUMMARY  Patient ID: Janet Daugherty MRN: [id number] DOB/AGE: 1985/10/18 36 y.o.  Admit date: 11/18/2021 Discharge date: 11/20/2021  Discharge Diagnoses Patient Active Problem List   Diagnosis Date Noted   Acute cholecystitis 11/18/2021    Consultants None  Procedures 11/19/2021: Robotic assisted laparoscopic cholecystectomy  HPI: Janet Daugherty is a 36 y.o. female with acute cholecystitis.   Hospital Course: Informed consent was obtained and documented, and patient underwent uneventful robotic assisted laparoscopic cholecystectomy (Dr Everlene Farrier, 11/19/2021).  Post-operatively, patient's pain/symptoms improved/resolved and advancement of patient's diet and ambulation were well-tolerated. The remainder of patient's hospital course was essentially unremarkable, and discharge planning was initiated accordingly with patient safely able to be discharged home with appropriate discharge instructions, antibiotics (Augmentin x7 days), pain control, and outpatient follow-up after all of her questions were answered to her expressed satisfaction.   Discharge Condition: Good   Physical Examination:  Constitutional: Well appearing female, NAD Pulmonary: Normal effort, no respiratory distress Gastrointestinal: Soft, non-tender, non-distended, no rebound/guarding Skin: Laparoscopic incisions are CDI with dermabond, no erythema or drainage    Allergies as of 11/20/2021   No Known Allergies      Medication List     STOP taking these medications    meloxicam 15 MG tablet Commonly known as: MOBIC       TAKE these medications    amoxicillin-clavulanate 875-125 MG tablet Commonly known as: AUGMENTIN Take 1 tablet by mouth 2 (two) times daily for 7 days.   Depo-Provera 150 MG/ML injection Generic drug: medroxyPROGESTERone Inject 150 mg into the muscle every 3 (three) months.   ibuprofen 600 MG tablet Commonly known as:  ADVIL Take 1 tablet (600 mg total) by mouth every 6 (six) hours as needed.   oxyCODONE 5 MG immediate release tablet Commonly known as: Oxy IR/ROXICODONE Take 1 tablet (5 mg total) by mouth every 6 (six) hours as needed for severe pain or breakthrough pain.          Follow-up Information     Donovan Kail, PA-C. Go on 12/11/2021.   Specialty: Physician Assistant Why: Go to appointment on 09/26 at 130 PM Contact information: [address] [place] [address] [phone]                  Time spent on discharge management including discussion of hospital course, clinical condition, outpatient instructions, prescriptions, and follow up with the patient and members of the medical team: >30 minutes  -- Lynden Oxford , PA-C [facility]  11/20/2021, 8:17 AM [phone] M-F: 7am - 4pm

## 2021-11-21 LAB — SURGICAL PATHOLOGY

## 2021-12-11 ENCOUNTER — Encounter: Payer: Self-pay | Admitting: Physician Assistant

## 2021-12-11 ENCOUNTER — Ambulatory Visit (INDEPENDENT_AMBULATORY_CARE_PROVIDER_SITE_OTHER): Payer: Self-pay | Admitting: Physician Assistant

## 2021-12-11 ENCOUNTER — Other Ambulatory Visit: Payer: Self-pay

## 2021-12-11 VITALS — BP 109/72 | HR 73 | Temp 98.3°F | Ht 60.0 in | Wt 148.0 lb

## 2021-12-11 DIAGNOSIS — Z09 Encounter for follow-up examination after completed treatment for conditions other than malignant neoplasm: Secondary | ICD-10-CM

## 2021-12-11 DIAGNOSIS — K810 Acute cholecystitis: Secondary | ICD-10-CM

## 2021-12-11 NOTE — Patient Instructions (Signed)

## 2021-12-11 NOTE — Progress Notes (Signed)
[facility] POST-OP OFFICE VISIT  12/11/2021  HPI: Janet Daugherty is a 36 y.o. female 21 days s/p robotic assisted laparoscopic cholecystectomy for acute cholecystitis with Dr Dahlia Byes   She is doing very well No abdominal pain, nausea, emesis, or diarrhea No issues with PO intake Incisions are healing well No other complaints   Vital signs: BP 109/72   Pulse 73   Temp 98.3 F (36.8 C) (Oral)   Ht 5' (1.524 m)   Wt 148 lb (67.1 kg)   SpO2 99%   BMI 28.90 kg/m    Physical Exam: Constitutional: Well appearing female, NAD Abdomen: Soft, non-tender, non-distended, no rebound/guarding Skin: Laparoscopic incisions are healing well, no erythema or drainage   Assessment/Plan: This is a 36 y.o. female 21 days s/p robotic assisted laparoscopic cholecystectomy for acute cholecystitis with Dr Dahlia Byes    - Pain control prn  - Reviewed wound care recommendation  - Reviewed lifting restrictions; 4 weeks total  - Reviewed surgical pathology; [place]  - She can follow up on as needed basis; She understands to call with questions/concerns  -- Edison Simon, PA-C [facility] 12/11/2021, 1:24 PM M-F: 7am - 4pm

## 2022-03-18 HISTORY — PX: CHOLECYSTECTOMY: SHX55

## 2022-10-23 ENCOUNTER — Ambulatory Visit
Admission: EM | Admit: 2022-10-23 | Discharge: 2022-10-23 | Disposition: A | Discharge status: Home / Self Care | Payer: Self-pay | Attending: Internal Medicine | Admitting: Internal Medicine

## 2022-10-23 DIAGNOSIS — L02211 Cutaneous abscess of abdominal wall: Secondary | ICD-10-CM

## 2022-10-23 MED ORDER — SULFAMETHOXAZOLE-TRIMETHOPRIM 800-160 MG PO TABS: 1.0000 | ORAL_TABLET | Freq: Two times a day (BID) | ORAL | 0 refills | Status: AC

## 2022-10-23 NOTE — ED Triage Notes (Signed)
Pt c/o cyst in lower abd x5 days. States area draining,red,tender & pain. No otc tx.

## 2022-10-23 NOTE — ED Provider Notes (Addendum)
[facility]    CSN: [id number] Arrival date & time: 10/23/22  1437      History   Chief Complaint Chief Complaint  Patient presents with   Cyst    HPI Janet Daugherty is a 37 y.o. female who presents due to developing an abscess on R lower abdomen 5 days ago. She has been squeezing it and has been draining some puss. She has never had this before. She denies fever, chills, sweats of myalgias. LMP 2 weeks ago.     History reviewed. No pertinent past medical history.  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      Home Medications    Prior to Admission medications   Medication Sig Start Date End Date Taking? Authorizing Provider  sulfamethoxazole-trimethoprim (BACTRIM DS) 800-160 MG tablet Take 1 tablet by mouth 2 (two) times daily for 10 days. 10/23/22 11/02/22 Yes Rodriguez-Southworth, Viviana Simpler    Family History History reviewed. No pertinent family history.  Social History Social History   Tobacco Use   Smoking status: Never   Smokeless tobacco: Never  Substance Use Topics   Alcohol use: No   Drug use: No     Allergies   Patient has no known allergies.   Review of Systems Review of Systems As noted in HPI  Physical Exam Triage Vital Signs ED Triage Vitals  Encounter Vitals Group     BP 10/23/22 1502 116/70     Systolic BP Percentile --      Diastolic BP Percentile --      Pulse Rate 10/23/22 1502 66     Resp 10/23/22 1502 16     Temp 10/23/22 1502 99 F (37.2 C)     Temp Source 10/23/22 1502 Oral     SpO2 10/23/22 1502 99 %     Weight 10/23/22 1502 149 lb (67.6 kg)     Height --      Head Circumference --      Peak Flow --      Pain Score 10/23/22 1505 8     Pain Loc --      Pain Education  --      Exclude from Growth Chart --    No data found.  Updated Vital Signs BP 116/70 (BP Location: Left Arm)   Pulse 66   Temp 99 F (37.2 C) (Oral)   Resp 16   Wt 149 lb (67.6 kg)   SpO2 99%   BMI 29.10 kg/m   Visual Acuity Right Eye Distance:   Left Eye Distance:   Bilateral Distance:    Right Eye Near:   Left Eye Near:    Bilateral Near:     Physical Exam Vitals and nursing note reviewed.  HENT:     Right Ear: External ear normal.     Left Ear: External ear normal.  Eyes:     General: No scleral icterus.    Conjunctiva/sclera: Conjunctivae normal.  Pulmonary:     Effort: Pulmonary effort is normal.  Musculoskeletal:        General: Normal range of motion.  Skin:    Comments: R lower abdomen with 3 cm x 2 cm abscess on R lower abdomen with 2 open  areas that are draining. There is surrounding erythema and induration on the border.   Neurological:     Mental Status: She is oriented to person, place, and time.     Gait: Gait normal.  Psychiatric:        Mood and Affect: Mood normal.        Behavior: Behavior normal.        Thought Content: Thought content normal.        Judgment: Judgment normal.      UC Treatments / Results  Labs (all labs ordered are listed, but only abnormal results are displayed) Labs Reviewed - No data to display  EKG   Radiology No results found.  Procedures Procedures (including critical care time)  Medications Ordered in UC Medications - No data to display  Initial Impression / Assessment and Plan / UC Course  I have reviewed the triage vital signs and the nursing notes.  Abscess L abdomen  I placed her on Bactrim as noted. She is to apply heat on area for 20 minutes 2-3 times a day for 3-5 days. If the area gets larger and stops draining, will need to come in for I&D Final Clinical Impressions(s) / UC Diagnoses   Final diagnoses:  Abscess of skin of abdomen   Discharge Instructions   None    ED Prescriptions      Medication Sig Dispense Auth. Provider   sulfamethoxazole-trimethoprim (BACTRIM DS) 800-160 MG tablet Take 1 tablet by mouth 2 (two) times daily for 10 days. 20 tablet Rodriguez-Southworth, Nettie Elm, PA-C      PDMP not reviewed this encounter.   Garey Ham, PA-C 10/23/22 1605    Rodriguez-Southworth, [place], PA-C 10/23/22 1607    Rodriguez-Southworth, [place], PA-C 10/23/22 1608    Rodriguez-Southworth, [place], PA-C 10/23/22 1718

## 2023-04-18 ENCOUNTER — Ambulatory Visit: Payer: Self-pay | Admitting: Family Medicine

## 2023-04-18 VITALS — BP 109/71 | HR 80 | Ht 61.0 in | Wt 146.2 lb

## 2023-04-18 DIAGNOSIS — Z309 Encounter for contraceptive management, unspecified: Secondary | ICD-10-CM

## 2023-04-18 DIAGNOSIS — Z131 Encounter for screening for diabetes mellitus: Secondary | ICD-10-CM

## 2023-04-18 DIAGNOSIS — Z3202 Encounter for pregnancy test, result negative: Secondary | ICD-10-CM

## 2023-04-18 DIAGNOSIS — Z113 Encounter for screening for infections with a predominantly sexual mode of transmission: Secondary | ICD-10-CM

## 2023-04-18 DIAGNOSIS — Z124 Encounter for screening for malignant neoplasm of cervix: Secondary | ICD-10-CM

## 2023-04-18 LAB — PREGNANCY, URINE: Preg Test, Ur: NEGATIVE

## 2023-04-18 LAB — WET PREP FOR TRICH, YEAST, CLUE
Trichomonas Exam: NEGATIVE
Yeast Exam: NEGATIVE

## 2023-04-18 LAB — HM HIV SCREENING LAB: HM HIV Screening: NEGATIVE

## 2023-04-18 NOTE — Progress Notes (Signed)
[employer] HEALTH DEPARTMENT [facility] [address] [place] [address] Main phone: [phone]  Family Planning Visit - Repeat Yearly Visit  Subjective:  Janet Daugherty is a 38 y.o. G4P0  being seen today for an annual wellness visit and to discuss contraception options.   The patient is currently using No Method - Other Reason for pregnancy prevention. She was on depo but stopped due to prolonged irregular bleeding.   Patient does not want a pregnancy in the next year. Patient politely declines contraception discussion today. She reports using coitus interruptus.   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 She then remembers the frequent urination. She reports urge to urinate every 30 minutes, even overnight. Duration 6 months.   Patient denies rashes, lesions, malodorous vaginal discharge. No dysuria or burning with urination.   Review of Systems  Constitutional: Negative.   Genitourinary: Negative.   Musculoskeletal: Negative.    See flowsheet for other program required questions.   Diabetes screening This patient is 38 y.o. with a BMI of Body mass index is 27.62 kg/m.Janet Daugherty  Is patient eligible for diabetes screening (age >35 and BMI >25)?  yes  Was Hgb A1c ordered? yes  STI screening Patient reports 1 of partners in last year.  Does this patient desire STI screening?  Yes, after discussion  Hepatitis C screening Has patient been screened once for HCV in the past?  No  No results found for: "HCVAB"  Does the patient meet criteria for HCV testing? No  Criteria:  Since the last HCV result, does the patient have any of the following? - Current drug use - Have  a partner with drug use - Has been incarcerated  Hepatitis B screening Does the patient meet criteria for HBV testing? No Criteria:  -Household, sexual or needle sharing contact with HBV -History of drug use -HIV positive -Those with known Hep C  Cervical Cancer Screening  Result Date Procedure Results Follow-ups  04/24/2016 HM PAP SMEAR HM Pap smear: Negative/HPV neg    Health Maintenance Due  Topic Date Due   Hepatitis C Screening  Never done   DTaP/Tdap/Td (1 - Tdap) Never done   Cervical Cancer Screening (HPV/Pap Cotest)  04/25/2019   INFLUENZA VACCINE  Never done   COVID-19 Vaccine (1 - 2024-25 season) Never done   The following portions of the patient's history were reviewed and updated as appropriate: allergies, current medications, past family history, past medical history, past social history, past surgical history and problem list. Problem list updated.  Objective:   Vitals:   04/18/23 0914  BP: 109/71  Pulse: 80  Weight: 146 lb 3.2 oz (66.3 kg)  Height: 5\' 1"  (1.549 m)   Physical Exam Vitals and nursing note reviewed. Exam conducted with a chaperone present Rachel Bo T. present).  Constitutional:      Appearance: Normal appearance.  HENT:     Head: Normocephalic and atraumatic.     Mouth/Throat:     Mouth: Mucous membranes are moist.     Pharynx: Oropharynx is clear. No oropharyngeal exudate or posterior oropharyngeal erythema.  Eyes:     General: No scleral icterus.       Right eye:  No discharge.        Left eye: No discharge.     Conjunctiva/sclera: Conjunctivae normal.  Pulmonary:     Effort: Pulmonary effort is normal.  Abdominal:     General: Abdomen is flat.     Palpations: There is no mass.     Tenderness: There is no abdominal tenderness. There is no rebound.  Genitourinary:    General: Normal vulva.     Exam position: Lithotomy position.     Pubic Area: No rash or pubic lice.      Labia:        Right: No rash or lesion.        Left: No rash  or lesion.      Vagina: Normal. No vaginal discharge, erythema, bleeding or lesions.     Cervix: No cervical motion tenderness, discharge, friability, lesion or erythema.     Uterus: Normal.      Adnexa: Right adnexa normal and left adnexa normal.     Rectum: Normal.     Comments: pH = 4 Lymphadenopathy:     Head:     Right side of head: No preauricular or posterior auricular adenopathy.     Left side of head: No preauricular or posterior auricular adenopathy.     Cervical: No cervical adenopathy.     Upper Body:     Right upper body: No supraclavicular, axillary or epitrochlear adenopathy.     Left upper body: No supraclavicular, axillary or epitrochlear adenopathy.     Lower Body: No right inguinal adenopathy. No left inguinal adenopathy.  Skin:    General: Skin is warm and dry.     Findings: No rash.  Neurological:     Mental Status: She is alert and oriented to person, place, and time.  Psychiatric:        Mood and Affect: Mood normal.        Behavior: Behavior normal.    Assessment and Plan:  [name] is a 38 y.o. female G4P0 presenting to the [facility] Department for an yearly wellness and contraception visit  Contraception counseling: Reviewed options based on patient desire and reproductive life plan. Patient is interested in No Method - No Contraceptive Precautions.   Risks, benefits, and typical effectiveness rates were reviewed.  Questions were answered.  Written information was also given to the patient to review.    The patient will follow up in  1 years for surveillance.  The patient was told to call with any further questions, or with any concerns about this method of contraception.  Emphasized use of condoms 100% of the time for STI prevention.  Educated on ECP and assessed need for ECP. Not indicated today.   Screening for diabetes mellitus (DM) -     Hgb A1c w/o eAG -     Pregnancy, urine  Screening examination for venereal  disease -     HIV [place] [facility] -     Syphilis Serology, [facility] -     Chlamydia/Gonorrhea [facility] -     WET PREP FOR TRICH, YEAST, CLUE -     Pregnancy, urine  Cervical cancer screening -     IGP, Aptima HPV -     Pregnancy, urine   No follow-ups on file.  No future appointments.  Clydene Fake, MD

## 2023-04-18 NOTE — Progress Notes (Signed)
Patient is here for Annual phyicals and Pap smear test. FP packet given to patient and contents reviewed. Wet prep results reviewed with pt, no treatment required per standing order. Condoms declined. Sonda Primes, RN.

## 2023-04-18 NOTE — Patient Instructions (Addendum)
I translated the following text using Google translate.  Please excuse any errors.  He traducido el siguiente texto con el traductor de Microbiologist. Disculpe cualquier error.   PAP Smear Today we performed a PAP smear to screen for cervical cancer. The results should be back in 1-2 weeks.  Once we have the results we can determine when your next screening should be.   Prueba de Papanicolaou Vito Backers nos hicimos una prueba de Papanicolaou para detectar cncer de cuello uterino. Los resultados deberan estar listos en 1 o 2 semanas. Una vez que tengamos los Mona, podemos determinar cundo debe realizarse su prxima prueba de deteccin.  STI screening - Today we obtained a vaginal swab to screen for gonorrhea, chlamydia, and trichomonas - We also obtained a blood sample to screen for HIV and syphilis - If the results are normal, I will send you a letter or MyChart message. If the results are abnormal, I will give you a call.   Prueba de deteccin de ITS - Hoy nos hicieron un hisopado vaginal para detectar gonorrea, clamidia y tricomonas. - Tambin nos hicieron Lauris Poag de sangre para detectar VIH y sfilis. - Si los resultados son normales, le enviar una carta o un mensaje de MyChart. Si los resultados son anormales, Engineer, structural.  Estimated time frame for results collected at the [facility] Department: Same day Trichomonas Yeast BV (bacterial vaginosis)  Within 2 weeks Gonorrhea Chlamydia  Within 3-4 weeks HIV Syphilis Hepatitis B Hepatitis C

## 2023-04-19 LAB — HGB A1C W/O EAG: Hgb A1c MFr Bld: 5.8 % — ABNORMAL HIGH (ref 4.8–5.6)

## 2023-04-21 ENCOUNTER — Encounter: Payer: Self-pay | Admitting: Family Medicine

## 2023-04-23 ENCOUNTER — Telehealth: Payer: Self-pay | Admitting: Family Medicine

## 2023-04-23 LAB — IGP, APTIMA HPV
HPV Aptima: NEGATIVE
PAP Smear Comment: 0

## 2023-04-23 NOTE — Telephone Encounter (Signed)
Spanish interpreter [place], ID# [phone] used for phone call to patient.   Called patient to discuss A1c within pre-diabetic range. No answer, left HIPAA safe VM with call back number for any questions.   Fayette Pho, MD 04/23/23  2:27 PM

## 2023-06-01 NOTE — Progress Notes (Deleted)
 New patient visit  Patient: Janet Daugherty   DOB: 09-Apr-1985   38 y.o. Female  MRN: [id number] Visit Date: 06/03/2023  Today's healthcare provider: Debera Lat, PA-C   No chief complaint on file.  Subjective    Janet Daugherty is a 38 y.o. female who presents today as a new patient to establish care.  HPI  *** Discussed the use of AI scribe software for clinical note transcription with the patient, who gave verbal consent to proceed.  History of Present Illness            No past medical history on file. Past Surgical History:  Procedure Laterality Date  . CESAREAN SECTION    . CHOLECYSTECTOMY  2024   Family Status  Relation Name Status  . Mother  Alive  . Father  Deceased  . Sister  Alive  . MGM  Deceased  . MGF  Deceased  . PGM  Deceased  . PGF  Deceased  No partnership data on file   No family history on file. Social History   Socioeconomic History  . Marital status: Married    Spouse name: Not on file  . Number of children: Not on file  . Years of education: Not on file  . Highest education level: Not on file  Occupational History  . Not on file  Tobacco Use  . Smoking status: Never  . Smokeless tobacco: Never  Vaping Use  . Vaping status: Never Used  Substance and Sexual Activity  . Alcohol use: No  . Drug use: No  . Sexual activity: Yes    Birth control/protection: Injection  Other Topics Concern  . Not on file  Social History Narrative  . Not on file   Social Drivers of Health   Financial Resource Strain: Not on file  Food Insecurity: Not on file  Transportation Needs: Not on file  Physical Activity: Not on file  Stress: Not on file  Social Connections: Not on file   No outpatient medications prior to visit.   No facility-administered medications prior to visit.   No Known Allergies  There is no immunization history for the selected administration types on file for this patient.  Health Maintenance  Topic Date  Due  . Hepatitis C Screening  Never done  . DTaP/Tdap/Td (1 - Tdap) Never done  . INFLUENZA VACCINE  Never done  . COVID-19 Vaccine (1 - 2024-25 season) Never done  . Cervical Cancer Screening (HPV/Pap Cotest)  04/17/2028  . HIV Screening  Completed  . HPV VACCINES  Aged Out    Patient Care Team: Pcp, No as PCP - General  Review of Systems  All other systems reviewed and are negative. Except see HPI   {Insert previous labs (optional):23779} {See past labs  Heme  Chem  Endocrine  Serology  Results Review (optional):1}   Objective    There were no vitals taken for this visit. {Insert last BP/Wt (optional):23777}{See vitals history (optional):1}   Physical Exam Vitals reviewed.  Constitutional:      General: She is not in acute distress.    Appearance: Normal appearance. She is well-developed. She is not diaphoretic.  HENT:     Head: Normocephalic and atraumatic.  Eyes:     General: No scleral icterus.    Conjunctiva/sclera: Conjunctivae normal.  Neck:     Thyroid: No thyromegaly.  Cardiovascular:     Rate and Rhythm: Normal rate and regular rhythm.     Pulses: Normal pulses.  Heart sounds: Normal heart sounds. No murmur heard. Pulmonary:     Effort: Pulmonary effort is normal. No respiratory distress.     Breath sounds: Normal breath sounds. No wheezing, rhonchi or rales.  Musculoskeletal:     Cervical back: Neck supple.     Right lower leg: No edema.     Left lower leg: No edema.  Lymphadenopathy:     Cervical: No cervical adenopathy.  Skin:    General: Skin is warm and dry.     Findings: No rash.  Neurological:     Mental Status: She is alert and oriented to person, place, and time. Mental status is at baseline.  Psychiatric:        Mood and Affect: Mood normal.        Behavior: Behavior normal.   Depression Screen    04/18/2023    9:27 AM  PHQ 2/9 Scores  PHQ - 2 Score 0   No results found for any visits on 06/03/23.  Assessment & Plan      *** Assessment and Plan              Encounter to establish care Welcomed to our clinic Reviewed past medical hx, social hx, family hx and surgical hx Pt advised to send all vaccination records or screening   No follow-ups on file.    The patient was advised to call back or seek an in-person evaluation if the symptoms worsen or if the condition fails to improve as anticipated.  I discussed the assessment and treatment plan with the patient. The patient was provided an opportunity to ask questions and all were answered. The patient agreed with the plan and demonstrated an understanding of the instructions.  I, Debera Lat, PA-C have reviewed all documentation for this visit. The documentation on  06/03/2023   for the exam, diagnosis, procedures, and orders are all accurate and complete.  Debera Lat, [facility], MMS [facility] [phone] (phone) [phone] (fax)  [facility]

## 2023-06-03 ENCOUNTER — Ambulatory Visit: Payer: Self-pay | Admitting: Physician Assistant

## 2023-06-03 DIAGNOSIS — Z7689 Persons encountering health services in other specified circumstances: Secondary | ICD-10-CM

## 2023-06-03 DIAGNOSIS — Z559 Problems related to education and literacy, unspecified: Secondary | ICD-10-CM

## 2023-06-03 DIAGNOSIS — Z758 Other problems related to medical facilities and other health care: Secondary | ICD-10-CM

## 2023-06-03 DIAGNOSIS — R7303 Prediabetes: Secondary | ICD-10-CM

## 2023-06-11 ENCOUNTER — Encounter: Payer: Self-pay | Admitting: Nurse Practitioner

## 2023-06-11 ENCOUNTER — Ambulatory Visit: Payer: Self-pay | Admitting: Family Medicine

## 2023-06-11 VITALS — BP 104/63 | HR 72 | Wt 146.0 lb

## 2023-06-11 DIAGNOSIS — Z3042 Encounter for surveillance of injectable contraceptive: Secondary | ICD-10-CM

## 2023-06-11 DIAGNOSIS — Z3009 Encounter for other general counseling and advice on contraception: Secondary | ICD-10-CM

## 2023-06-11 MED ORDER — MEDROXYPROGESTERONE ACETATE 150 MG/ML IM SUSP
150.0000 mg | INTRAMUSCULAR | Status: DC
  Administered 2023-06-11 – 2023-09-03 (×2): 150 mg via INTRAMUSCULAR

## 2023-06-11 NOTE — Progress Notes (Signed)
   [facility] Problem Visit  [facility]  Subjective:  Aunesty Tyson is a 38 y.o. being seen today for   Chief Complaint  Patient presents with   Contraception    HPI   PE done in Jan 2025, pt presents today for starting depo. Reports LMP was 06/01/23- and last sex was 06/08/23.    Health Maintenance Due  Topic Date Due   Hepatitis C Screening  Never done   DTaP/Tdap/Td (1 - Tdap) Never done   INFLUENZA VACCINE  Never done   COVID-19 Vaccine (1 - 2024-25 season) Never done    ROS  The following portions of the patient's history were reviewed and updated as appropriate: allergies, current medications, past family history, past medical history, past social history, past surgical history and problem list. Problem list updated.   See flowsheet for other program required questions.  Objective:   Vitals:   06/11/23 1540  BP: 104/63  Pulse: 72  Weight: 146 lb (66.2 kg)    Physical Exam Deferred- done Jan 2025   Assessment and Plan:  Chenille Toor is a 38 y.o. female presenting to the [facility] Department for a Women's Health problem visit  1. Encounter for Depo-Provera contraception (Primary) -ok to start depo today Answered pt questions- reviewed irregular bleeding possible in the first few months but depo usually leads to lighter periods  - medroxyPROGESTERone (DEPO-PROVERA) injection 150 mg     Return in about 3 months (around 09/11/2023) for depo injection.  No future appointments. Due to language barrier, a Spanish interpreter Aaron Mose.) was present in person during the history-taking, subsequent discussion, and physical exam with this patient.     Lenice Llamas, [place]

## 2023-06-11 NOTE — Progress Notes (Signed)
 Pt is here for depo, given in R deltoid and tolerated well.  Reminder card given. Gaspar Garbe, RN

## 2023-09-03 ENCOUNTER — Ambulatory Visit: Payer: Self-pay

## 2023-09-03 VITALS — BP 101/57 | Ht 61.0 in | Wt 149.5 lb

## 2023-09-03 DIAGNOSIS — Z3009 Encounter for other general counseling and advice on contraception: Secondary | ICD-10-CM

## 2023-09-03 DIAGNOSIS — Z3042 Encounter for surveillance of injectable contraceptive: Secondary | ICD-10-CM

## 2023-09-03 NOTE — Progress Notes (Signed)
 12 Weeks   0 Days since last Depo Interpreter: pacific interpreter id [phone]   Voices no concerns today.  Counseled to adhere to 11 to 13 week intervals between depo injections for optimal benefit.   Depo consent signed today.  Depo given today per order by Fain Home, FNP  dated 06/11/2023.  Tolerated well left deltoid.  Next depo due 11/19/2023,  has reminder card.  Lori-Ann Lindfors, RN

## 2023-12-18 ENCOUNTER — Ambulatory Visit (LOCAL_COMMUNITY_HEALTH_CENTER): Payer: Self-pay

## 2023-12-18 VITALS — BP 107/59 | Wt 151.0 lb

## 2023-12-18 DIAGNOSIS — Z3009 Encounter for other general counseling and advice on contraception: Secondary | ICD-10-CM

## 2023-12-18 DIAGNOSIS — Z3042 Encounter for surveillance of injectable contraceptive: Secondary | ICD-10-CM

## 2023-12-18 NOTE — Progress Notes (Signed)
 Pt came in today for next depo injection ,but voiced she can change her method she is not happy because she has had continuous bleeding since July . Stated she did not want to get the injection today. The method that the patient had in mind was Nexplanon. Went to speak with provider C.Lynn,MD stated that until she can get an  appointment she could take OTC O pill or not have sex until her appointment. Pt was able to get an appt for Monday to come in and see provider about implant. Spanish interpreter present I.Teixeria.

## 2023-12-22 ENCOUNTER — Ambulatory Visit (LOCAL_COMMUNITY_HEALTH_CENTER): Payer: Self-pay

## 2023-12-22 VITALS — BP 110/74 | HR 68 | Ht 61.0 in | Wt 151.2 lb

## 2023-12-22 DIAGNOSIS — Z30017 Encounter for initial prescription of implantable subdermal contraceptive: Secondary | ICD-10-CM

## 2023-12-22 DIAGNOSIS — Z131 Encounter for screening for diabetes mellitus: Secondary | ICD-10-CM

## 2023-12-22 DIAGNOSIS — Z3009 Encounter for other general counseling and advice on contraception: Secondary | ICD-10-CM

## 2023-12-22 MED ORDER — ETONOGESTREL 68 MG ~~LOC~~ IMPL
68.0000 mg | DRUG_IMPLANT | Freq: Once | SUBCUTANEOUS | Status: AC
  Administered 2023-12-22: 68 mg via SUBCUTANEOUS

## 2023-12-22 NOTE — Progress Notes (Signed)
 [employer] HEALTH DEPARTMENT [facility] [address] [place] [address] Main phone: [phone]  Women's Health Problem Visit   Subjective:  Janet Daugherty is a 38 y.o. being seen today for Nexplanon insertion.   Chief Complaint  Patient presents with   Contraception   HPI: Janet Daugherty would like a Nexplanon placed. She has been using Depo until recently, last injection was [redacted]w[redacted]d ago. Has had problems with prolonged periods with the Depo. She has irregular periods that last 2+ weeks. Discussed that irregular bleeding is also a common side effect of the implant. After discussion, she still wants the implant.  No other concerns today. Discussed elevated diabetes screening test 8 months ago. Will repeat today.  Had full physical in January. Declines STI testing. No vaginal, breast concerns.   Health Maintenance Due  Topic Date Due   Hepatitis C Screening  Never done   DTaP/Tdap/Td (1 - Tdap) Never done   Hepatitis B Vaccines 19-59 Average Risk (1 of 3 - 19+ 3-dose series) Never done   HPV VACCINES (1 - 3-dose SCDM series) Never done   Influenza Vaccine  Never done   COVID-19 Vaccine (1 - 2024-25 season) Never done   Review of Systems  All other systems reviewed and are negative.  The following portions of the patient's history were reviewed and updated as appropriate: allergies, current medications, past family history, past medical history, past social history, past surgical history and problem list. Problem list updated.  See flowsheet for other program required questions.  Objective:   Vitals:   12/22/23 1358  BP: 110/74  Pulse: 68  Weight: 151 lb 3.2 oz (68.6 kg)  Height: 5' 1 (1.549 m)   Physical Exam Constitutional:      Appearance: Normal appearance.  HENT:     Head: Normocephalic.     Mouth/Throat:     Mouth: Mucous membranes are moist.  Eyes:     General: No scleral icterus.       Right eye: No discharge.         Left eye: No discharge.  Pulmonary:     Effort: Pulmonary effort is normal.  Skin:    General: Skin is warm and dry.  Neurological:     General: No focal deficit present.     Mental Status: She is alert.  Psychiatric:        Mood and Affect: Mood normal.        Behavior: Behavior normal.    Assessment and Plan:  Janet Daugherty is a 38 y.o. female presenting to the [facility] Department for a [facility] contraception visit  1. Family planning (Primary)  2. Nexplanon insertion  Procedure:  Nexplanon Insertion  Patient identified, informed consent performed, consent signed.   Patient does understand that irregular bleeding is a very common side effect of this medication. She was advised to have backup contraception after placement. Patient was determined to meet WHO criteria for not being pregnant. Appropriate time out taken.  The insertion site was identified 8-10 cm (3-4 inches) from the medial epicondyle of the humerus and 3-5 cm (1.25-2 inches) posterior to (below) the sulcus (groove) between the biceps and triceps muscles of the patient's left arm and marked.  Patient was prepped with alcohol swab and then injected with 3 ml of 1% lidocaine .  Arm was prepped with chlorhexidene, Nexplanon removed from packaging,  Device confirmed in needle, then inserted full length of needle and withdrawn per handbook  instructions. Nexplanon was able to palpated in the patient's arm; patient palpated the insert herself. There was minimal blood loss.  Patient insertion site covered with guaze and a pressure bandage to reduce any bruising.  The patient tolerated the procedure well and was given post procedure instructions.    Nexplanon:   Counseled patient to take OTC analgesic starting as soon as lidocaine  starts to wear off and take regularly for at least 48 hr to decrease discomfort.  Specifically to take with food or milk to decrease stomach upset and for IB 600 mg (3  tablets) every 6 hrs; IB 800 mg (4 tablets) every 8 hrs; or Aleve 2 tablets every 12 hrs.   3. Screening for diabetes mellitus  - Hgb A1c w/o eAG   Due to language barrier, a Spanish interpreter Kemp T.) was present in person during the history-taking, subsequent discussion, and physical exam with this patient.    Return in about 3 months (around 03/23/2024) for physical exam.  No future appointments.  Damien FORBES Satchel, NP

## 2023-12-22 NOTE — Addendum Note (Signed)
 Addended by: DINEEN MORT R on: 12/22/2023 03:03 PM   Modules accepted: Orders

## 2023-12-22 NOTE — Progress Notes (Signed)
 Nexplanon card given to pt. Larraine JONELLE Northern, RN

## 2023-12-23 ENCOUNTER — Ambulatory Visit: Payer: Self-pay

## 2023-12-23 LAB — HGB A1C W/O EAG: Hgb A1c MFr Bld: 5.5 % (ref 4.8–5.6)

## 2023-12-23 NOTE — Progress Notes (Signed)
 Normal HgbA1c - no diabetes or pre-diabetes.  Janet Daugherty [facility]
